# Patient Record
Sex: Male | Born: 1975 | Race: White | Hispanic: No | Marital: Single | Smoking: Former smoker
Health system: Southern US, Community
[De-identification: ages and names within clinical notes are randomized; demographics above are authoritative.]

## PROBLEM LIST (undated history)

## (undated) HISTORY — PX: OTHER SURGICAL HISTORY: SHX169

---

## 2011-05-30 ENCOUNTER — Emergency Department: Payer: Self-pay | Admitting: Emergency Medicine

## 2016-12-03 DIAGNOSIS — F1290 Cannabis use, unspecified, uncomplicated: Secondary | ICD-10-CM | POA: Insufficient documentation

## 2016-12-03 DIAGNOSIS — L03211 Cellulitis of face: Secondary | ICD-10-CM | POA: Insufficient documentation

## 2016-12-03 DIAGNOSIS — F17200 Nicotine dependence, unspecified, uncomplicated: Secondary | ICD-10-CM | POA: Insufficient documentation

## 2016-12-03 LAB — COMPREHENSIVE METABOLIC PANEL
ALK PHOS: 74 U/L (ref 38–126)
ALT: 22 U/L (ref 17–63)
AST: 25 U/L (ref 15–41)
Albumin: 3.9 g/dL (ref 3.5–5.0)
Anion gap: 7 (ref 5–15)
BUN: 12 mg/dL (ref 6–20)
CALCIUM: 8.8 mg/dL — AB (ref 8.9–10.3)
CO2: 27 mmol/L (ref 22–32)
Chloride: 102 mmol/L (ref 101–111)
Creatinine, Ser: 0.86 mg/dL (ref 0.61–1.24)
GFR calc Af Amer: >60 mL/min (ref >60)
GFR calc non Af Amer: >60 mL/min (ref >60)
GLUCOSE: 98 mg/dL (ref 65–99)
Potassium: 3.6 mmol/L (ref 3.5–5.1)
SODIUM: 136 mmol/L (ref 135–145)
Total Bilirubin: 0.4 mg/dL (ref 0.3–1.2)
Total Protein: 7.4 g/dL (ref 6.5–8.1)

## 2016-12-03 LAB — CBC WITH DIFFERENTIAL/PLATELET
Basophils Absolute: 0.1 10*3/uL (ref 0–0.1)
Basophils Relative: 1 %
EOS PCT: 1 %
Eosinophils Absolute: 0.1 10*3/uL (ref 0–0.7)
HEMATOCRIT: 41.3 % (ref 40.0–52.0)
Hemoglobin: 13.9 g/dL (ref 13.0–18.0)
LYMPHS PCT: 17 %
Lymphs Abs: 2.5 10*3/uL (ref 1.0–3.6)
MCH: 30.3 pg (ref 26.0–34.0)
MCHC: 33.8 g/dL (ref 32.0–36.0)
MCV: 89.6 fL (ref 80.0–100.0)
Monocytes Absolute: 1.7 10*3/uL — ABNORMAL HIGH (ref 0.2–1.0)
Monocytes Relative: 12 %
NEUTROS ABS: 10.1 10*3/uL — AB (ref 1.4–6.5)
NEUTROS PCT: 69 %
Platelets: 311 10*3/uL (ref 150–440)
RBC: 4.6 MIL/uL (ref 4.40–5.90)
RDW: 13.6 % (ref 11.5–14.5)
WBC: 14.5 10*3/uL — AB (ref 3.8–10.6)

## 2016-12-03 NOTE — ED Triage Notes (Signed)
Patient reports swelling and pain to left face.  Noted swelling and redness to left upper jaw.  Patient reports he got antibiotics from friends but it has not helped.

## 2016-12-04 ENCOUNTER — Emergency Department
Admission: EM | Admit: 2016-12-04 | Discharge: 2016-12-04 | Disposition: A | Discharge status: Home / Self Care | Payer: Self-pay | Attending: Emergency Medicine | Admitting: Emergency Medicine

## 2016-12-04 ENCOUNTER — Emergency Department: Payer: Self-pay

## 2016-12-04 ENCOUNTER — Encounter: Payer: Self-pay | Admitting: Emergency Medicine

## 2016-12-04 DIAGNOSIS — L03211 Cellulitis of face: Secondary | ICD-10-CM

## 2016-12-04 MED ORDER — CLINDAMYCIN PHOSPHATE 600 MG/50ML IV SOLN
600.0000 mg | Freq: Once | INTRAVENOUS | Status: AC
  Administered 2016-12-04: 600 mg via INTRAVENOUS
  Filled 2016-12-04: qty 50

## 2016-12-04 MED ORDER — ONDANSETRON 4 MG PO TBDP: ORAL_TABLET | ORAL | 0 refills | Status: DC

## 2016-12-04 MED ORDER — HYDROCODONE-ACETAMINOPHEN 5-325 MG PO TABS: 1.0000 | ORAL_TABLET | ORAL | 0 refills | Status: DC | PRN

## 2016-12-04 MED ORDER — OXYCODONE-ACETAMINOPHEN 5-325 MG PO TABS
1.0000 | ORAL_TABLET | Freq: Once | ORAL | Status: AC
  Administered 2016-12-04: 1 via ORAL
  Filled 2016-12-04: qty 1

## 2016-12-04 MED ORDER — CLINDAMYCIN HCL 150 MG PO CAPS: 450.0000 mg | ORAL_CAPSULE | Freq: Four times a day (QID) | ORAL | 0 refills | Status: DC

## 2016-12-04 MED ORDER — DEXAMETHASONE SODIUM PHOSPHATE 10 MG/ML IJ SOLN
10.0000 mg | Freq: Once | INTRAMUSCULAR | Status: AC
  Administered 2016-12-04: 10 mg via INTRAVENOUS
  Filled 2016-12-04: qty 1

## 2016-12-04 MED ORDER — DOCUSATE SODIUM 100 MG PO CAPS: ORAL_CAPSULE | ORAL | 0 refills | Status: DC

## 2016-12-04 NOTE — Discharge Instructions (Signed)
As we discussed, it appears that you have an infection in your face that needs antibiotics.  We gave you a dose of clindamycin 600 mg IV as well as Decadron 10 mg IV which is a steroid which should help decrease the inflammation.  Please call this morning to the ENT clinic and set up the next available follow-up appointment with either Dr. Willeen Cass or one of his colleagues.  It is important that you take the prescribed antibiotics for the full course of treatment and follow up with the specialist within the next few days.  Please return immediately to the emergency department if you feel like your symptoms are getting worse or if you develop new symptoms that concern you.

## 2016-12-04 NOTE — ED Notes (Signed)
Pt waiting patiently for treatment room; says pressure to the left side of his face is increasing; now has headache across the back of his head; will discuss with MD

## 2016-12-04 NOTE — ED Provider Notes (Signed)
[facility] Emergency Department Provider Note  ____________________________________________   First MD Initiated Contact with Patient 12/04/16 [phone]     (approximate)  I have reviewed the triage vital signs and the nursing notes.   HISTORY  Chief Complaint Abscess    HPI Johnathan Wagner is a 41 y.o. male with no significant past medical history who presents for evaluation of pain and swelling in the left side of his face.  He reports that it started about 4 days ago and has gradually gotten worse.  It is painful to the touch and it seems to have started just to the left side of his nose and has spread to below his eye and down his cheek.  He feels a firmness to the skin but there has been no open wound and no drainage.  In spite of the swelling of his cheek and below his eye, he is not having any trouble with his vision and has not had any double vision.  He is not having trouble moving, opening, nor closing his left eye.  The tenderness to palpation is notable in the cheek but not periorbital.  He is not having any trouble swallowing, his voice is not changed, and he has had no respiratory difficulties.  He does not have a sore throat and although he has chronic problems with his teeth he is not having any dental pain at this time.  He denies fever/chills, chest pain, shortness of breath, nausea, vomiting, abdominal pain, dysuria.   History reviewed. No pertinent past medical history.  There are no active problems to display for this patient.   History reviewed. No pertinent surgical history.  Prior to Admission medications   Medication Sig Start Date End Date Taking? Authorizing Provider  clindamycin (CLEOCIN) 150 MG capsule Take 3 capsules (450 mg total) by mouth 4 (four) times daily. 12/04/16 12/14/16  Loleta Rose, MD  docusate sodium (COLACE) 100 MG capsule Take 1 tablet once or twice daily as needed for constipation while taking narcotic pain medicine 12/04/16    Loleta Rose, MD  HYDROcodone-acetaminophen (NORCO/VICODIN) 5-325 MG tablet Take 1-2 tablets by mouth every 4 (four) hours as needed for moderate pain. 12/04/16   Loleta Rose, MD  ondansetron (ZOFRAN ODT) 4 MG disintegrating tablet Allow 1-2 tablets to dissolve in your mouth every 8 hours as needed for nausea/vomiting 12/04/16   Loleta Rose, MD    Allergies Patient has no known allergies.  History reviewed. No pertinent family history.  Social History Social History  Substance Use Topics  . Smoking status: Current Every Day Smoker  . Smokeless tobacco: Never Used  . Alcohol use Yes    Review of Systems Constitutional: No fever/chills Eyes: No visual changes. ENT: Gradually worsening swelling and pain in the left side of his face just lateral to the nose and spreading throughout his cheek starting about 4 days ago.  No sore throat, no difficulty swallowing. Cardiovascular: Denies chest pain. Respiratory: Denies shortness of breath. Gastrointestinal: No abdominal pain.  No nausea, no vomiting.  No diarrhea.  No constipation. Genitourinary: Negative for dysuria. Musculoskeletal: Negative for back pain. Integumentary: Negative for rash. Neurological: Negative for headaches, focal weakness or numbness.   ____________________________________________   PHYSICAL EXAM:  VITAL SIGNS: ED Triage Vitals  Enc Vitals Group     BP 12/03/16 2308 (!) 151/86     Pulse Rate 12/03/16 2308 (!) 105     Resp 12/03/16 2308 20     Temp 12/03/16 2308 98.5  F (36.9 C)     Temp Source 12/03/16 2308 Oral     SpO2 12/03/16 2308 100 %     Weight 12/03/16 2306 190 lb (86.2 kg)     Height 12/03/16 2306 6' (1.829 m)     Head Circumference --      Peak Flow --      Pain Score 12/04/16 0112 10     Pain Loc --      Pain Edu? --      Excl. in GC? --     Constitutional: Alert and oriented. Well appearing and in no acute distress. Eyes: Conjunctivae are normal. PERRL. EOMI. No evidence of  entrapment.  No chemosis. Head: Atraumatic. Nose: No congestion/rhinnorhea.  Obvious swelling and erythema to the left side of his face with some induration just left-lateral to his nose.  No fluctuence.  There is some left infraorbital lower lid edema but no evidence of orbital cellulitis, just edema from the surrounding facial cellulitis.  There are no obvious intranasal abscesses. Mouth/Throat: Mucous membranes are moist.  Scattered dental caries but without any evidence of dental abscess or infection.  Nontender to palpation.  No fluctuance or induration under tongue.  No evidence of intraoral abscess or Ludwig's angina. Neck: No stridor.  No meningeal signs.  Some left submandibular lymphadenopathy Cardiovascular: Normal rate, regular rhythm. Good peripheral circulation. Grossly normal heart sounds. Respiratory: Normal respiratory effort.  No retractions. Lungs CTAB. Gastrointestinal: Soft and nontender. No distention.  Musculoskeletal: No lower extremity tenderness nor edema. No gross deformities of extremities. Neurologic:  Normal speech and language. No gross focal neurologic deficits are appreciated.  Skin:  Skin is warm, dry and intact. No rash noted. Psychiatric: Mood and affect are normal. Speech and behavior are normal.  ____________________________________________   LABS (all labs ordered are listed, but only abnormal results are displayed)  Labs Reviewed  CBC WITH DIFFERENTIAL/PLATELET - Abnormal; Notable for the following:       Result Value   WBC 14.5 (*)    Neutro Abs 10.1 (*)    Monocytes Absolute 1.7 (*)    All other components within normal limits  COMPREHENSIVE METABOLIC PANEL - Abnormal; Notable for the following:    Calcium 8.8 (*)    All other components within normal limits   ____________________________________________  EKG  None - EKG not ordered by ED physician ____________________________________________  RADIOLOGY   Ct Maxillofacial Wo  Contrast  Result Date: 12/04/2016 CLINICAL DATA:  Swelling and pain to the left face with redness in the upper jaw EXAM: CT MAXILLOFACIAL WITHOUT CONTRAST TECHNIQUE: Multidetector CT imaging of the maxillofacial structures was performed. Multiplanar CT image reconstructions were also generated. A small metallic BB was placed on the right temple in order to reliably differentiate right from left. COMPARISON:  None. FINDINGS: Osseous: Mandibular heads are normally position. No mandibular fracture. Pterygoid plates and zygomatic arches appear within normal limits. No nasal bone fracture. Multiple root lucencies of mandibular molars. Orbits: Orbital walls are intact. The globes are grossly unremarkable. No intra or extraconal soft tissue abnormality is evident. Sinuses: Mild mucosal thickening within the ethmoid and maxillary sinuses. No acute fluid levels. Soft tissues: Large amount of soft tissue swelling over the left nasal area. Large soft tissue swelling in the left infraorbital region anterior to the maxillary sinus and extending inferiorly over the left maxilla and mandible. No definite underlying osseous erosions or periostitis. Multiple enlarged submandibular and cervical nodes. Limited intracranial: No significant or unexpected finding. IMPRESSION: 1.  Large amount of soft tissue swelling involving the left nasal area and the subcutaneous soft tissues anterior to the left maxillary sinus and extending inferiorly over the left mandible consistent with cellulitis. Indeterminate for abscess in the absence of intravenous contrast. 2. Multiple enlarged lymph nodes within the neck, likely reactive. 3. No fracture is visualized. Electronically Signed   By: Jasmine Pang M.D.   On: 12/04/2016 01:58    ____________________________________________   PROCEDURES  Critical Care performed: No   Procedure(s) performed:   Procedures   ____________________________________________   INITIAL IMPRESSION /  ASSESSMENT AND PLAN / ED COURSE  Pertinent labs & imaging results that were available during my care of the patient were reviewed by me and considered in my medical decision making (see chart for details).  I evaluated the patient after his lab work and noncontrast CT scan were already completed.  It appears based on the radiologist's interpretation that he has facial cellulitis without a focal area of abscess.  His vital signs are stable except for occasional borderline tachycardia and he has a mild leukocytosis.  I will give him 600 mg of IV clindamycin for broad antibacterial coverage and a dose of Decadron 10 mg IV to help with the inflammation.  I do not believe he is suffering from orbital cellulitis but rather that he has some periorbital edema that is spreading from the facial infection.  He has no evidence of entrapment, chemosis, or other ophthalmological issue.   Clinical Course as of Dec 04 457  Mon Dec 04, 2016  1610 Checked on the patient, he is sleeping comfortably.  He is comfortable with the plan for close outpatient follow-up and he promises me that he will set up an appointment.  I gave him a prescription for clindamycin and a coupon from [employer] to help with the cost.  I gave strict return precautions for him to return if his symptoms are worsening.  He agrees with the plan  [CF]    Clinical Course User Index [CF] Loleta Rose, MD    ____________________________________________  FINAL CLINICAL IMPRESSION(S) / ED DIAGNOSES  Final diagnoses:  Facial cellulitis     MEDICATIONS GIVEN DURING THIS VISIT:  Medications  oxyCODONE-acetaminophen (PERCOCET/ROXICET) 5-325 MG per tablet 1 tablet (1 tablet Oral Given 12/04/16 0123)  clindamycin (CLEOCIN) IVPB 600 mg (0 mg Intravenous Stopped 12/04/16 0417)  dexamethasone (DECADRON) injection 10 mg (10 mg Intravenous Given 12/04/16 0347)     NEW OUTPATIENT MEDICATIONS STARTED DURING THIS VISIT:  New Prescriptions    CLINDAMYCIN (CLEOCIN) 150 MG CAPSULE    Take 3 capsules (450 mg total) by mouth 4 (four) times daily.   DOCUSATE SODIUM (COLACE) 100 MG CAPSULE    Take 1 tablet once or twice daily as needed for constipation while taking narcotic pain medicine   HYDROCODONE-ACETAMINOPHEN (NORCO/VICODIN) 5-325 MG TABLET    Take 1-2 tablets by mouth every 4 (four) hours as needed for moderate pain.   ONDANSETRON (ZOFRAN ODT) 4 MG DISINTEGRATING TABLET    Allow 1-2 tablets to dissolve in your mouth every 8 hours as needed for nausea/vomiting    Modified Medications   No medications on file    Discontinued Medications   No medications on file     Note:  This document was prepared using Dragon voice recognition software and may include unintentional dictation errors.    Loleta Rose, MD 12/04/16 [phone]

## 2016-12-05 ENCOUNTER — Inpatient Hospital Stay
Admission: EM | Admit: 2016-12-05 | Discharge: 2016-12-07 | DRG: 603 | Disposition: A | Discharge status: Home / Self Care | Payer: Self-pay | Attending: Internal Medicine | Admitting: Internal Medicine

## 2016-12-05 DIAGNOSIS — L0390 Cellulitis, unspecified: Secondary | ICD-10-CM | POA: Diagnosis present

## 2016-12-05 DIAGNOSIS — Z79899 Other long term (current) drug therapy: Secondary | ICD-10-CM

## 2016-12-05 DIAGNOSIS — L03211 Cellulitis of face: Principal | ICD-10-CM | POA: Diagnosis present

## 2016-12-05 DIAGNOSIS — F17200 Nicotine dependence, unspecified, uncomplicated: Secondary | ICD-10-CM | POA: Diagnosis present

## 2016-12-05 DIAGNOSIS — R220 Localized swelling, mass and lump, head: Secondary | ICD-10-CM

## 2016-12-05 DIAGNOSIS — Z880 Allergy status to penicillin: Secondary | ICD-10-CM

## 2016-12-05 DIAGNOSIS — Z792 Long term (current) use of antibiotics: Secondary | ICD-10-CM

## 2016-12-05 NOTE — ED Triage Notes (Signed)
Pt seen here 2 days ago facial swelling and was given IV antibiotics for dental infection, today swelling became worse to left face.

## 2016-12-06 ENCOUNTER — Encounter: Payer: Self-pay | Admitting: Internal Medicine

## 2016-12-06 DIAGNOSIS — L0390 Cellulitis, unspecified: Secondary | ICD-10-CM | POA: Diagnosis present

## 2016-12-06 LAB — CBC
HEMATOCRIT: 38.4 % — AB (ref 40.0–52.0)
Hemoglobin: 13 g/dL (ref 13.0–18.0)
MCH: 30 pg (ref 26.0–34.0)
MCHC: 33.9 g/dL (ref 32.0–36.0)
MCV: 88.5 fL (ref 80.0–100.0)
PLATELETS: 313 10*3/uL (ref 150–440)
RBC: 4.34 MIL/uL — ABNORMAL LOW (ref 4.40–5.90)
RDW: 13.8 % (ref 11.5–14.5)
WBC: 15.6 10*3/uL — ABNORMAL HIGH (ref 3.8–10.6)

## 2016-12-06 LAB — BASIC METABOLIC PANEL
Anion gap: 5 (ref 5–15)
Anion gap: 8 (ref 5–15)
BUN: 14 mg/dL (ref 6–20)
BUN: 17 mg/dL (ref 6–20)
CHLORIDE: 101 mmol/L (ref 101–111)
CHLORIDE: 103 mmol/L (ref 101–111)
CO2: 27 mmol/L (ref 22–32)
CO2: 30 mmol/L (ref 22–32)
CREATININE: 0.61 mg/dL (ref 0.61–1.24)
CREATININE: 0.64 mg/dL (ref 0.61–1.24)
Calcium: 8.4 mg/dL — ABNORMAL LOW (ref 8.9–10.3)
Calcium: 8.7 mg/dL — ABNORMAL LOW (ref 8.9–10.3)
GFR calc Af Amer: >60 mL/min (ref >60)
GFR calc non Af Amer: >60 mL/min (ref >60)
GFR calc non Af Amer: >60 mL/min (ref >60)
GLUCOSE: 124 mg/dL — AB (ref 65–99)
Glucose, Bld: 121 mg/dL — ABNORMAL HIGH (ref 65–99)
POTASSIUM: 3.7 mmol/L (ref 3.5–5.1)
Potassium: 3.9 mmol/L (ref 3.5–5.1)
SODIUM: 136 mmol/L (ref 135–145)
SODIUM: 138 mmol/L (ref 135–145)

## 2016-12-06 LAB — CBC WITH DIFFERENTIAL/PLATELET
BASOS PCT: 1 %
Basophils Absolute: 0.1 10*3/uL (ref 0–0.1)
EOS PCT: 1 %
Eosinophils Absolute: 0.1 10*3/uL (ref 0–0.7)
HCT: 39.8 % — ABNORMAL LOW (ref 40.0–52.0)
HEMOGLOBIN: 13.7 g/dL (ref 13.0–18.0)
LYMPHS ABS: 2.8 10*3/uL (ref 1.0–3.6)
Lymphocytes Relative: 19 %
MCH: 30.7 pg (ref 26.0–34.0)
MCHC: 34.5 g/dL (ref 32.0–36.0)
MCV: 89 fL (ref 80.0–100.0)
MONOS PCT: 11 %
Monocytes Absolute: 1.7 10*3/uL — ABNORMAL HIGH (ref 0.2–1.0)
NEUTROS PCT: 68 %
Neutro Abs: 10.2 10*3/uL — ABNORMAL HIGH (ref 1.4–6.5)
PLATELETS: 332 10*3/uL (ref 150–440)
RBC: 4.47 MIL/uL (ref 4.40–5.90)
RDW: 13.9 % (ref 11.5–14.5)
WBC: 14.9 10*3/uL — AB (ref 3.8–10.6)

## 2016-12-06 LAB — MRSA PCR SCREENING: MRSA by PCR: POSITIVE — AB

## 2016-12-06 MED ORDER — SODIUM CHLORIDE 0.9 % IV SOLN: 250.0000 mL | INTRAVENOUS | Status: DC | PRN

## 2016-12-06 MED ORDER — PNEUMOCOCCAL VAC POLYVALENT 25 MCG/0.5ML IJ INJ
0.5000 mL | INJECTION | INTRAMUSCULAR | Status: AC
  Administered 2016-12-07: 0.5 mL via INTRAMUSCULAR
  Filled 2016-12-06: qty 0.5

## 2016-12-06 MED ORDER — VANCOMYCIN HCL IN DEXTROSE 1-5 GM/200ML-% IV SOLN
1000.0000 mg | Freq: Once | INTRAVENOUS | Status: AC
  Administered 2016-12-06: 18:00:00 1000 mg via INTRAVENOUS
  Filled 2016-12-06: qty 200

## 2016-12-06 MED ORDER — MUPIROCIN 2 % EX OINT
1.0000 "application " | TOPICAL_OINTMENT | Freq: Two times a day (BID) | CUTANEOUS | Status: DC
  Administered 2016-12-06 – 2016-12-07 (×2): 1 via NASAL
  Filled 2016-12-06: qty 22

## 2016-12-06 MED ORDER — CARVEDILOL 3.125 MG PO TABS: 3.1250 mg | ORAL_TABLET | Freq: Two times a day (BID) | ORAL | Status: DC

## 2016-12-06 MED ORDER — CHLORHEXIDINE GLUCONATE CLOTH 2 % EX PADS
6.0000 | MEDICATED_PAD | Freq: Every day | CUTANEOUS | Status: DC
  Administered 2016-12-07: 06:00:00 6 via TOPICAL

## 2016-12-06 MED ORDER — SODIUM CHLORIDE 0.9 % IV BOLUS (SEPSIS)
1000.0000 mL | Freq: Once | INTRAVENOUS | Status: AC
  Administered 2016-12-06: 1000 mL via INTRAVENOUS

## 2016-12-06 MED ORDER — DOCUSATE SODIUM 100 MG PO CAPS: 100.0000 mg | ORAL_CAPSULE | Freq: Two times a day (BID) | ORAL | Status: DC

## 2016-12-06 MED ORDER — VANCOMYCIN HCL IN DEXTROSE 1-5 GM/200ML-% IV SOLN
1000.0000 mg | Freq: Two times a day (BID) | INTRAVENOUS | Status: DC
  Administered 2016-12-06 – 2016-12-07 (×2): 1000 mg via INTRAVENOUS
  Filled 2016-12-06 (×3): qty 200

## 2016-12-06 MED ORDER — ACETAMINOPHEN 650 MG RE SUPP: 650.0000 mg | Freq: Four times a day (QID) | RECTAL | Status: DC | PRN

## 2016-12-06 MED ORDER — ACETAMINOPHEN 325 MG PO TABS: 650.0000 mg | ORAL_TABLET | Freq: Four times a day (QID) | ORAL | Status: DC | PRN

## 2016-12-06 MED ORDER — SODIUM CHLORIDE 0.9% FLUSH
3.0000 mL | INTRAVENOUS | Status: DC | PRN
  Administered 2016-12-07: 06:00:00 3 mL via INTRAVENOUS
  Filled 2016-12-06: qty 3

## 2016-12-06 MED ORDER — ENOXAPARIN SODIUM 40 MG/0.4ML ~~LOC~~ SOLN
40.0000 mg | SUBCUTANEOUS | Status: DC
  Administered 2016-12-06: 20:00:00 40 mg via SUBCUTANEOUS
  Filled 2016-12-06: qty 0.4

## 2016-12-06 MED ORDER — SODIUM CHLORIDE 0.9 % IV SOLN
INTRAVENOUS | Status: AC
  Filled 2016-12-06: qty 3

## 2016-12-06 MED ORDER — MORPHINE SULFATE (PF) 2 MG/ML IV SOLN
2.0000 mg | INTRAVENOUS | Status: DC | PRN
  Administered 2016-12-06 (×2): 2 mg via INTRAVENOUS
  Filled 2016-12-06 (×2): qty 1

## 2016-12-06 MED ORDER — ONDANSETRON HCL 4 MG/2ML IJ SOLN
4.0000 mg | Freq: Once | INTRAMUSCULAR | Status: AC
  Administered 2016-12-06: 4 mg via INTRAVENOUS
  Filled 2016-12-06: qty 2

## 2016-12-06 MED ORDER — CEFAZOLIN SODIUM-DEXTROSE 1-4 GM/50ML-% IV SOLN
1.0000 g | Freq: Three times a day (TID) | INTRAVENOUS | Status: DC
  Administered 2016-12-06 – 2016-12-07 (×4): 1 g via INTRAVENOUS
  Filled 2016-12-06 (×6): qty 50

## 2016-12-06 MED ORDER — SODIUM CHLORIDE 0.9 % IV SOLN
3.0000 g | Freq: Once | INTRAVENOUS | Status: AC
  Administered 2016-12-06: 3 g via INTRAVENOUS
  Filled 2016-12-06: qty 3

## 2016-12-06 MED ORDER — ONDANSETRON 4 MG PO TBDP
4.0000 mg | ORAL_TABLET | Freq: Three times a day (TID) | ORAL | Status: DC | PRN
  Filled 2016-12-06: qty 1

## 2016-12-06 MED ORDER — HYDROCODONE-ACETAMINOPHEN 5-325 MG PO TABS: 1.0000 | ORAL_TABLET | ORAL | Status: DC | PRN

## 2016-12-06 MED ORDER — SODIUM CHLORIDE 0.9% FLUSH
3.0000 mL | Freq: Two times a day (BID) | INTRAVENOUS | Status: DC
  Administered 2016-12-06 – 2016-12-07 (×4): 3 mL via INTRAVENOUS

## 2016-12-06 MED ORDER — MORPHINE SULFATE (PF) 4 MG/ML IV SOLN
4.0000 mg | Freq: Once | INTRAVENOUS | Status: AC
  Administered 2016-12-06: 4 mg via INTRAVENOUS
  Filled 2016-12-06: qty 1

## 2016-12-06 NOTE — Consult Note (Signed)
..   Johnathan Wagner, Johnathan Wagner [id number] [birth year] Johnathan Dilling, MD  Reason for Consult: Facial cellulitis  HPI: 41 y.o. Male admitted for IV antibiotics for left sided facial cellulitis.  Was seen in ED several days ago and discharged home on Clindamyicin QID but had persistent swelling and worsened swelling around left eye.  Presented to ED again and admitted for IV therapy.  Reports left sided facial pain.  Denies any cutaneous sores.  Reports poor dentition with several molar teeth.  Has not seen a dentist in quite some time.  Reports facial pain, denies breathing or difficulty swallowing.  Had issues with vision due to swelling but reports that has resolving since being admitted.  Allergies:  Allergies  Allergen Reactions  . Penicillins     Headache,  only had when he was a child     ROS: Review of systems normal other than 12 systems except per HPI.  PMH: History reviewed. No pertinent past medical history.  FH:  Family History  Problem Relation Age of Onset  . Diabetes Father   . CAD Neg Hx   . COPD Neg Hx     SH:  Social History   Social History  . Marital status: Single    Spouse name: N/A  . Number of children: N/A  . Years of education: N/A   Occupational History  . Corporate investment banker    Social History Main Topics  . Smoking status: Current Every Day Smoker    Packs/day: 0.25    Types: Cigarettes  . Smokeless tobacco: Never Used  . Alcohol use Yes  . Drug use: Yes    Types: Marijuana  . Sexual activity: Yes   Other Topics Concern  . Not on file   Social History Narrative  . No narrative on file    PSH:  Past Surgical History:  Procedure Laterality Date  . none      Physical  Exam:  GEN-  CN 2-12 grossly intact and symmetric. EARS-  External ears clear OC/OP-  Edema and erythema of left maxillary gums with purulence coming from around gingival buccal sulcus.  This is able to be milked out into oral cavity. SKIN-  Edema and erythema and induration  around left nasal side wall extending down to mandible on left.  No fluctuance felt.  NOSE- Nasal cavity without polyps or purulence.  NECK-  Reactive lymphadenopathy RESP- unlabored CARD-  RRR   A/P: Facial cellulitis/phlegmon  Plan:  Most likely dental source given drainage site on gum line and location.  Appears to be responding well to current therapy and is autodraining.  CT scan reviewed and no obvious abscess seen.  Recommend continuation of IV abx and if continued improvement ok to discharge home tomorrow.   Thank you for allowing me to participate in the care of this patient.  Please re-consult if any issues arise.   Johnathan Wagner 12/06/2016 1:11 PM

## 2016-12-06 NOTE — Plan of Care (Signed)
Problem: Education: Goal: Knowledge of [facility] Education information/materials will improve Outcome: Progressing Pt likes to be called Johnathan Wagner  PAST MEDICAL HISTORY:  No past medical history on file.       PAST SURGICAL HISTORY: Past Surgical History:  Procedure Laterality Date  . none     Pt is well controlled with home medications

## 2016-12-06 NOTE — Care Management Note (Signed)
Case Management Note  Patient Details  Name: Johnathan Wagner MRN: [id number] Date of Birth: 1976-02-12  Subjective/Objective:                  Met with patient and his sister to discuss discharge planning. His address on file is in [facility] however he states he has no intentions of returning to [facility]. He is currently living with his sister in [place]. He states he has no income and hasn't worked in several months. He has no PCP. He states he is independent with daily activities.  Action/Plan: Free clinics for [facility] area/zip code shared with patient. Contact information delivered for [place]. Application and referral to Medication Management . Notified RN that patient states he is allergic to PCN.   Expected Discharge Date:                  Expected Discharge Plan:     In-House Referral:     Discharge planning Services  CM Consult, [facility], Medication Assistance  Post Acute Care Choice:    Choice offered to:  Patient, Sibling  DME Arranged:    DME Agency:     HH Arranged:    [place] Agency:     Status of Service:  In process, will continue to follow  If discussed at Long Length of Stay Meetings, dates discussed:    Additional Comments:  Marshell Garfinkel, RN 12/06/2016, 11:40 AM

## 2016-12-06 NOTE — Progress Notes (Signed)
Pharmacy Antibiotic Note  Johnathan Wagner is a 41 y.o. male admitted on 12/05/2016 with facial cellulitis.  Pharmacy has been consulted for vancomycin dosing.  Plan: Vancomycin 1000 mg dose given ~1800. Will follow with vancomycin 1000 mg IV q12h beginning at 2300 this evening Goal VT 10-15 mcg/mL VT ordered for 5/4 at 2230 which is prior to 5th dose and should represent steady state.  Kinetics: Actual body weight = 87 kg, CrCl 100 mL/min ke: 0.087 Half-life: 8 hrs Vd: 61 L Cmin ~11 mcg/mL  Height: 6' (182.9 cm) Weight: 191 lb 12.8 oz (87 kg) IBW/kg (Calculated) : 77.6  Temp (24hrs), Avg:98.7 F (37.1 C), Min:98 F (36.7 C), Max:99.2 F (37.3 C)   Recent Labs Lab 12/03/16 2314 12/06/16 0004 12/06/16 0547  WBC 14.5* 14.9* 15.6*  CREATININE 0.86 0.61 0.64    Estimated Creatinine Clearance: 137.4 mL/min (by C-G formula based on SCr of 0.64 mg/dL).    Allergies  Allergen Reactions  . Penicillins     Headache,  only had when he was a child    Antimicrobials this admission: cefazolin 5/2 >>  vancomycin 5/2 >>   Dose adjustments this admission:  Microbiology results: 5/2 BCx: Pending 5/2 MRSA PCR: Positive  Thank you for allowing pharmacy to be a part of this patient's care.  Cindi Carbon, PharmD, BCPS Clinical Pharmacist 12/06/2016 6:10 PM

## 2016-12-06 NOTE — ED Provider Notes (Signed)
[facility] Emergency Department Provider Note   ____________________________________________   First MD Initiated Contact with Patient 12/05/16 2359     (approximate)  I have reviewed the triage vital signs and the nursing notes.   HISTORY  Chief Complaint Facial Swelling    HPI Johnathan Wagner is a 41 y.o. male who returns to the ED from home with a chief complaint of worsening facial swelling. Patient was seen in the ED 2 days ago for same; had a CT scan which demonstrated facial cellulitis without discernible abscess. He was placed on clindamycin and discharged home. Patient reports compliance with clindamycin but states left facial swelling increased this afternoon. Complains of increased pain and swelling. Denies vision changes, throat swelling, difficulty swallowing or breathing. Denies associated fever, chills, chest pain, shortness of breath, abdominal pain, nausea, vomiting.Nothing makes his symptoms better or worse.   Past medical history None  There are no active problems to display for this patient.   No past surgical history on file.  Prior to Admission medications   Medication Sig Start Date End Date Taking? Authorizing Provider  clindamycin (CLEOCIN) 150 MG capsule Take 3 capsules (450 mg total) by mouth 4 (four) times daily. 12/04/16 12/14/16  Loleta Rose, MD  docusate sodium (COLACE) 100 MG capsule Take 1 tablet once or twice daily as needed for constipation while taking narcotic pain medicine 12/04/16   Loleta Rose, MD  HYDROcodone-acetaminophen (NORCO/VICODIN) 5-325 MG tablet Take 1-2 tablets by mouth every 4 (four) hours as needed for moderate pain. 12/04/16   Loleta Rose, MD  ondansetron (ZOFRAN ODT) 4 MG disintegrating tablet Allow 1-2 tablets to dissolve in your mouth every 8 hours as needed for nausea/vomiting 12/04/16   Loleta Rose, MD    Allergies Patient has no known allergies.  No family history on file.  Social  History Social History  Substance Use Topics  . Smoking status: Current Every Day Smoker  . Smokeless tobacco: Never Used  . Alcohol use Yes    Review of Systems   Constitutional: No fever/chills. Eyes: No visual changes. ENT: Positive for left facial pain and swelling. No sore throat. Cardiovascular: Denies chest pain. Respiratory: Denies shortness of breath. Gastrointestinal: No abdominal pain.  No nausea, no vomiting.  No diarrhea.  No constipation. Genitourinary: Negative for dysuria. Musculoskeletal: Negative for back pain. Skin: Negative for rash. Neurological: Negative for headaches, focal weakness or numbness.   ____________________________________________   PHYSICAL EXAM:  VITAL SIGNS: ED Triage Vitals  Enc Vitals Group     BP 12/05/16 2356 (!) 147/103     Pulse Rate 12/05/16 2356 (!) 110     Resp 12/05/16 2356 18     Temp 12/05/16 2356 98.5 F (36.9 C)     Temp Source 12/05/16 2356 Oral     SpO2 12/05/16 2356 98 %     Weight 12/05/16 2349 190 lb (86.2 kg)     Height 12/05/16 2349 6' (1.829 m)     Head Circumference --      Peak Flow --      Pain Score 12/05/16 2349 10     Pain Loc --      Pain Edu? --      Excl. in GC? --     Constitutional: Alert and oriented. Well appearing and in mild acute distress. Eyes: Conjunctivae are normal. PERRL. EOMI without signs of entrapment or orbital involvement. Left periorbital edema. Head: Atraumatic. Nose: No congestion/rhinnorhea. Moderate to large swelling, erythema and firm  induration from left nasal bridge extending to cheek. No fluctuance palpated. Mouth/Throat: Mucous membranes are moist.  Oropharynx slightly erythematous without tonsillar swelling, exudates or peritonsillar abscess. There is no hoarse voice. There is no drooling. There is no fluctuance or induration under the tongue. Scattered dental caries and fillings without obvious abscess. Neck: No stridor.  Supple neck without meningismus. No neck  swelling or masses. Hematological/Lymphatic/Immunilogical: Shotty anterior cervical lymphadenopathy. Cardiovascular: Normal rate, regular rhythm. Grossly normal heart sounds.  Good peripheral circulation. Respiratory: Normal respiratory effort.  No retractions. Lungs CTAB. Gastrointestinal: Soft and nontender. No distention. No abdominal bruits. No CVA tenderness. Musculoskeletal: No lower extremity tenderness nor edema.  No joint effusions. Neurologic:  Normal speech and language. No gross focal neurologic deficits are appreciated. No gait instability. Skin:  Skin is warm, dry and intact. No rash noted. No petechiae. Psychiatric: Mood and affect are normal. Speech and behavior are normal.  ____________________________________________   LABS (all labs ordered are listed, but only abnormal results are displayed)  Labs Reviewed  CBC WITH DIFFERENTIAL/PLATELET - Abnormal; Notable for the following:       Result Value   WBC 14.9 (*)    HCT 39.8 (*)    Neutro Abs 10.2 (*)    Monocytes Absolute 1.7 (*)    All other components within normal limits  BASIC METABOLIC PANEL - Abnormal; Notable for the following:    Glucose, Bld 121 (*)    Calcium 8.7 (*)    All other components within normal limits  CULTURE, BLOOD (ROUTINE X 2)  CULTURE, BLOOD (ROUTINE X 2)   ____________________________________________  EKG  None ____________________________________________  RADIOLOGY  None ____________________________________________   PROCEDURES  Procedure(s) performed: None  Procedures  Critical Care performed: No  ____________________________________________   INITIAL IMPRESSION / ASSESSMENT AND PLAN / ED COURSE  Pertinent labs & imaging results that were available during my care of the patient were reviewed by me and considered in my medical decision making (see chart for details).  41 year old male who returns to the ED for worsening facial cellulitis despite taking clindamycin  for 2 days. Will obtain screening lab work including blood cultures, administer IV analgesia, IV Unasyn for antibiotic. Anticipate hospitalization for IV antibiotics.  Clinical Course as of Dec 06 113  Wed Dec 06, 2016  0115 Updated patient of laboratory results. Will discuss with hospitalist to evaluate patient in the emergency department for admission.  [JS]    Clinical Course User Index [JS] Irean Hong, MD   Hold repeat CT scan at this time as patient had one on 4/30.   ____________________________________________   FINAL CLINICAL IMPRESSION(S) / ED DIAGNOSES  Final diagnoses:  Left facial swelling  Facial cellulitis      NEW MEDICATIONS STARTED DURING THIS VISIT:  New Prescriptions   No medications on file     Note:  This document was prepared using Dragon voice recognition software and may include unintentional dictation errors.    Irean Hong, MD 12/06/16 [id number]

## 2016-12-06 NOTE — Care Management (Signed)
RNCM left a list of dental clinics with patient.

## 2016-12-06 NOTE — Progress Notes (Signed)
[facility] [place] [facility]   PATIENT NAME: Johnathan Wagner    MR#:  [id number]  DATE OF BIRTH:  1976-05-22  SUBJECTIVE:  CHIEF COMPLAINT:   Chief Complaint  Patient presents with  . Facial Swelling    Patient came with complaint of left-sided facial swelling, which is getting worse in spite of getting oral clindamycin from ER 2 days ago.   On IV antibiotic for now and still complain of his swelling getting worse today.  REVIEW OF SYSTEMS:  CONSTITUTIONAL: No fever, fatigue or weakness.  EYES: No blurred or double vision.  EARS, NOSE, AND THROAT: No tinnitus or ear pain. Swelling on left face. RESPIRATORY: No cough, shortness of breath, wheezing or hemoptysis.  CARDIOVASCULAR: No chest pain, orthopnea, edema.  GASTROINTESTINAL: No nausea, vomiting, diarrhea or abdominal pain.  GENITOURINARY: No dysuria, hematuria.  ENDOCRINE: No polyuria, nocturia,  HEMATOLOGY: No anemia, easy bruising or bleeding SKIN: No rash or lesion. MUSCULOSKELETAL: No joint pain or arthritis.   NEUROLOGIC: No tingling, numbness, weakness.  PSYCHIATRY: No anxiety or depression.   ROS  DRUG ALLERGIES:   Allergies  Allergen Reactions  . Penicillins     Headache,  only had when he was a child     VITALS:  Blood pressure (!) 142/88, pulse (!) 101, temperature 99.1 F (37.3 C), temperature source Oral, resp. rate 18, height 6' (1.829 m), weight 87 kg (191 lb 12.8 oz), SpO2 97 %.  PHYSICAL EXAMINATION:   GENERAL:  41 y.o.-year-old patient lying in the bed with no acute distress.  EYES: Pupils equal, round, reactive to light and accommodation. No scleral icterus. Extraocular muscles intact.  HEENT: Head atraumatic, normocephalic. Oropharynx and nasopharynx clear.  Swelling around left eye Left maxillary are red, tender and swollen, I checked his oropharynx and he does not have any significant infections or any source in his teeth. NECK:  Supple, no jugular venous distention. No  thyroid enlargement, lymph adenopathy noted LUNGS: Normal breath sounds bilaterally, no wheezing, rales,rhonchi or crepitation. No use of accessory muscles of respiration.  CARDIOVASCULAR: S1, S2 normal. No murmurs, rubs, or gallops.  ABDOMEN: Soft, nontender, nondistended. Bowel sounds present. No organomegaly or mass.  EXTREMITIES: No pedal edema, cyanosis, or clubbing.  NEUROLOGIC: Cranial nerves II through XII are intact. Muscle strength 5/5 in all extremities. Sensation intact. Gait not checked.  PSYCHIATRIC: The patient is alert and oriented x 3.  SKIN: redness and swelling left facial area extending below the left eye to angle of mouth.  Physical Exam LABORATORY PANEL:   CBC  Recent Labs Lab 12/06/16 0547  WBC 15.6*  HGB 13.0  HCT 38.4*  PLT 313   ------------------------------------------------------------------------------------------------------------------  Chemistries   Recent Labs Lab 12/03/16 2314  12/06/16 0547  NA 136  < > 138  K 3.6  < > 3.7  CL 102  < > 103  CO2 27  < > 30  GLUCOSE 98  < > 124*  BUN 12  < > 14  CREATININE 0.86  < > 0.64  CALCIUM 8.8*  < > 8.4*  AST 25  --   --   ALT 22  --   --   ALKPHOS 74  --   --   BILITOT 0.4  --   --   < > = values in this interval not displayed. ------------------------------------------------------------------------------------------------------------------  Cardiac Enzymes No results for input(s): TROPONINI in the last 168 hours. ------------------------------------------------------------------------------------------------------------------  RADIOLOGY:  No results found.  ASSESSMENT AND PLAN:  Active Problems:   Cellulitis  * Left face cellulitis   IV cefazolin.    Will check MRSA screen.   Will call ENT to see, if any drainable abscess as his swelling is getting worse.  * leukocytosis  reactive LN pathy   Due to above.  All the records are reviewed and case discussed with Care  Management/Social Workerr. Management plans discussed with the patient, family and they are in agreement.  CODE STATUS: Full.  TOTAL TIME TAKING CARE OF THIS PATIENT: 35 minutes.     POSSIBLE D/C IN 1-2 DAYS, DEPENDING ON CLINICAL CONDITION.   [name] M.D on 12/06/2016   Between 7am to 6pm - Pager - [phone]  After 6pm go to [email or web address] - Social research officer, government  [facility]  Office  [phone]  CC: Primary care physician; No PCP Per Patient  Note: This dictation was prepared with Dragon dictation along with smaller phrase technology. Any transcriptional errors that result from this process are unintentional.

## 2016-12-06 NOTE — H&P (Signed)
[facility] - [place] [facility]   PATIENT NAME: Johnathan Wagner    MR#:  [id number]  DATE OF BIRTH:  1975/10/10  DATE OF ADMISSION:  12/05/2016  PRIMARY CARE PHYSICIAN: No PCP Per Patient   REQUESTING/REFERRING PHYSICIAN:   CHIEF COMPLAINT:   Chief Complaint  Patient presents with  . Facial Swelling    HISTORY OF PRESENT ILLNESS: Yifan Auker  is a 41 y.o. male with no significant past medical history presented to the emergency room with swelling of the left side of the face extending below the eye up to the angle of the mouth on the left side. Patient has redness, swelling of the left side of the face and pain. The pain is dull aching in nature 7 out of 10 on a scale of 1-10. No change in vision. Patient was seen here at the emergency room couple of days ago was evaluated for left facial cellulitis and was discharged home on oral clindamycin antibiotic. But since one day the swelling in the left side of the face increased and redness also increased and patient has more severe pain. He was evaluated with a CT maxillofacial area during the last presentation in the emergency room. Patient was started on IV antibiotics in the emergency room and hospitalist service was further consulted. No fever and chills. No chest pain and shortness of breath.  PAST MEDICAL HISTORY:  No past medical history on file.  PAST SURGICAL HISTORY: Past Surgical History:  Procedure Laterality Date  . none      SOCIAL HISTORY:  Social History  Substance Use Topics  . Smoking status: Current Every Day Smoker  . Smokeless tobacco: Never Used  . Alcohol use Yes    FAMILY HISTORY:  Family History  Problem Relation Age of Onset  . Diabetes Father   . CAD Neg Hx   . COPD Neg Hx     DRUG ALLERGIES: No Known Allergies  REVIEW OF SYSTEMS:   CONSTITUTIONAL: No fever, fatigue or weakness.  EYES: No blurred or double vision.  Swelling around left eye EARS, NOSE, AND THROAT: No tinnitus or  ear pain.  RESPIRATORY: No cough, shortness of breath, wheezing or hemoptysis.  CARDIOVASCULAR: No chest pain, orthopnea, edema.  GASTROINTESTINAL: No nausea, vomiting, diarrhea or abdominal pain.  GENITOURINARY: No dysuria, hematuria.  ENDOCRINE: No polyuria, nocturia,  HEMATOLOGY: No anemia, easy bruising or bleeding SKIN: redness left side of face MUSCULOSKELETAL: No joint pain or arthritis.   NEUROLOGIC: No tingling, numbness, weakness.  PSYCHIATRY: No anxiety or depression.   MEDICATIONS AT HOME:  Prior to Admission medications   Medication Sig Start Date End Date Taking? Authorizing Provider  clindamycin (CLEOCIN) 150 MG capsule Take 3 capsules (450 mg total) by mouth 4 (four) times daily. 12/04/16 12/14/16 Yes Loleta Rose, MD  docusate sodium (COLACE) 100 MG capsule Take 1 tablet once or twice daily as needed for constipation while taking narcotic pain medicine 12/04/16  Yes Loleta Rose, MD  HYDROcodone-acetaminophen (NORCO/VICODIN) 5-325 MG tablet Take 1-2 tablets by mouth every 4 (four) hours as needed for moderate pain. 12/04/16  Yes Loleta Rose, MD  ondansetron (ZOFRAN ODT) 4 MG disintegrating tablet Allow 1-2 tablets to dissolve in your mouth every 8 hours as needed for nausea/vomiting 12/04/16  Yes Loleta Rose, MD      PHYSICAL EXAMINATION:   VITAL SIGNS: Blood pressure (!) 150/104, pulse 72, temperature 98.5 F (36.9 C), temperature source Oral, resp. rate 18, height 6' (1.829 m), weight 86.2  kg (190 lb), SpO2 98 %.  GENERAL:  41 y.o.-year-old patient lying in the bed with no acute distress.  EYES: Pupils equal, round, reactive to light and accommodation. No scleral icterus. Extraocular muscles intact.  HEENT: Head atraumatic, normocephalic. Oropharynx and nasopharynx clear.  Swelling around left eye Left maxillary are red, tender and swollen NECK:  Supple, no jugular venous distention. No thyroid enlargement, lymph adenopathy noted LUNGS: Normal breath sounds  bilaterally, no wheezing, rales,rhonchi or crepitation. No use of accessory muscles of respiration.  CARDIOVASCULAR: S1, S2 normal. No murmurs, rubs, or gallops.  ABDOMEN: Soft, nontender, nondistended. Bowel sounds present. No organomegaly or mass.  EXTREMITIES: No pedal edema, cyanosis, or clubbing.  NEUROLOGIC: Cranial nerves II through XII are intact. Muscle strength 5/5 in all extremities. Sensation intact. Gait not checked.  PSYCHIATRIC: The patient is alert and oriented x 3.  SKIN: redness and swelling left facial area extending below the left eye to angle of mouth.  LABORATORY PANEL:   CBC  Recent Labs Lab 12/03/16 2314 12/06/16 0004  WBC 14.5* 14.9*  HGB 13.9 13.7  HCT 41.3 39.8*  PLT 311 332  MCV 89.6 89.0  MCH 30.3 30.7  MCHC 33.8 34.5  RDW 13.6 13.9  LYMPHSABS 2.5 2.8  MONOABS 1.7* 1.7*  EOSABS 0.1 0.1  BASOSABS 0.1 0.1   ------------------------------------------------------------------------------------------------------------------  Chemistries   Recent Labs Lab 12/03/16 2314 12/06/16 0004  NA 136 136  K 3.6 3.9  CL 102 101  CO2 27 27  GLUCOSE 98 121*  BUN 12 17  CREATININE 0.86 0.61  CALCIUM 8.8* 8.7*  AST 25  --   ALT 22  --   ALKPHOS 74  --   BILITOT 0.4  --    ------------------------------------------------------------------------------------------------------------------ estimated creatinine clearance is 137.4 mL/min (by C-G formula based on SCr of 0.61 mg/dL). ------------------------------------------------------------------------------------------------------------------ No results for input(s): TSH, T4TOTAL, T3FREE, THYROIDAB in the last 72 hours.  Invalid input(s): FREET3   Coagulation profile No results for input(s): INR, PROTIME in the last 168 hours. ------------------------------------------------------------------------------------------------------------------- No results for input(s): DDIMER in the last 72  hours. -------------------------------------------------------------------------------------------------------------------  Cardiac Enzymes No results for input(s): CKMB, TROPONINI, MYOGLOBIN in the last 168 hours.  Invalid input(s): CK ------------------------------------------------------------------------------------------------------------------ Invalid input(s): POCBNP  ---------------------------------------------------------------------------------------------------------------  Urinalysis No results found for: COLORURINE, APPEARANCEUR, LABSPEC, PHURINE, GLUCOSEU, HGBUR, BILIRUBINUR, KETONESUR, PROTEINUR, UROBILINOGEN, NITRITE, LEUKOCYTESUR   RADIOLOGY: No results found.  EKG: No orders found for this or any previous visit.  IMPRESSION AND PLAN: 41 year old male patient with no significant past medical history presented to the emergency room with left facial redness, swelling and pain. Admitting diagnosis 1. Left facial cellulitis 2. Left facial pain 3. Leukocytosis 4. Reactive lymphadenopathy Treatment plan Admit patient to medical floor Start patient on IV cefazolin Antibiotic Every 8 Hourly Follow-up WBC count Pain management with oral Percocet and IV morphine DVT prophylaxis subcutaneous Lovenox 40 MG daily Supportive care.  All the records are reviewed and case discussed with ED provider. Management plans discussed with the patient, family and they are in agreement.  CODE STATUS:FULL CODE Code Status History    This patient does not have a recorded code status. Please follow your organizational policy for patients in this situation.       TOTAL TIME TAKING CARE OF THIS PATIENT: 50 minutes.    Ihor Austin M.D on 12/06/2016 at 2:08 AM  Between 7am to 6pm - Pager - [phone]  After 6pm go to [email or web address] - password [email or web address] [facility]  Office  [phone]  CC: Primary care physician; No PCP Per Patient

## 2016-12-06 NOTE — Plan of Care (Addendum)
Problem: Safety: Goal: Ability to remain free from injury will improve Outcome: Progressing Patient is independent and with call the nurse or aide with any needs   Problem: Pain Managment: Goal: General experience of comfort will improve Outcome: Progressing Patient given morphine times 1 with improvement

## 2016-12-07 MED ORDER — CHLORHEXIDINE GLUCONATE CLOTH 2 % EX PADS: 6.0000 | MEDICATED_PAD | Freq: Every day | CUTANEOUS | 0 refills | Status: AC

## 2016-12-07 MED ORDER — SODIUM CHLORIDE 0.9 % IV SOLN
3.0000 g | Freq: Four times a day (QID) | INTRAVENOUS | Status: DC
  Administered 2016-12-07: 11:00:00 3 g via INTRAVENOUS
  Filled 2016-12-07 (×3): qty 3

## 2016-12-07 MED ORDER — DOXYCYCLINE HYCLATE 100 MG PO CAPS: 100.0000 mg | ORAL_CAPSULE | Freq: Two times a day (BID) | ORAL | 0 refills | Status: AC

## 2016-12-07 MED ORDER — MUPIROCIN 2 % EX OINT: 1.0000 "application " | TOPICAL_OINTMENT | Freq: Two times a day (BID) | CUTANEOUS | 0 refills | Status: AC

## 2016-12-07 MED ORDER — AMOXICILLIN-POT CLAVULANATE 875-125 MG PO TABS: 1.0000 | ORAL_TABLET | Freq: Two times a day (BID) | ORAL | 0 refills | Status: AC

## 2016-12-07 NOTE — Care Management (Addendum)
RNCM spoke with [facility] and they have all medications except Colace, narcotic, and Cholorhexidine Gluconate- patient will have to get these on his own at his preferred pharmacy. Message left for patient's sister Elana Almishe [phone]. [facility] before 430P. RN updated. Patient's sister callback this RNCM back. She appreciates the help with medications and will provided patient transportation to home today. No other RNCM needs.

## 2016-12-07 NOTE — Discharge Instructions (Signed)
Follow in a dentist clinic in 2 weeks.

## 2016-12-07 NOTE — Discharge Summary (Signed)
[facility] [place] at [facility]   PATIENT NAME: Johnathan Wagner    MR#:  [id number]  DATE OF BIRTH:  09/18/1975  DATE OF ADMISSION:  12/05/2016 ADMITTING PHYSICIAN: Ihor Austin, MD  DATE OF DISCHARGE: 12/07/2016  PRIMARY CARE PHYSICIAN: No PCP Per Patient    ADMISSION DIAGNOSIS:  Left facial swelling [R22.0] Facial cellulitis [L03.211]  DISCHARGE DIAGNOSIS:  Active Problems:   Cellulitis   SECONDARY DIAGNOSIS:  History reviewed. No pertinent past medical history.  HOSPITAL COURSE:   * Left face cellulitis   IV cefazolin.    positive for MRSA screen.   ENT consult called in, as his swelling is getting better- suggested to cont Abx.   Added IV vanc- received 2 doses in hospital.   Given one dose iV unasyn, tolerated- give augmentin and doxy on d/c.   Also giving mupirocin nasal cream and chlorhexidine wash for MRSA.  Advise to follow with Dentist in office at discharge.  * leukocytosis  reactive LN pathy   Due to above.  * Advise to fol  DISCHARGE CONDITIONS:   Stable.  CONSULTS OBTAINED:  Treatment Team:  Bud Face, MD  DRUG ALLERGIES:   Allergies  Allergen Reactions  . Penicillins     Headache,  only had when he was a child     DISCHARGE MEDICATIONS:   Current Discharge Medication List    START taking these medications   Details  amoxicillin-clavulanate (AUGMENTIN) 875-125 MG tablet Take 1 tablet by mouth 2 (two) times daily. Qty: 14 tablet, Refills: 0    Chlorhexidine Gluconate Cloth 2 % PADS Apply 6 each topically daily at 6 (six) AM. Qty: 30 each, Refills: 0    doxycycline (VIBRAMYCIN) 100 MG capsule Take 1 capsule (100 mg total) by mouth 2 (two) times daily. Qty: 14 capsule, Refills: 0    mupirocin ointment (BACTROBAN) 2 % Place 1 application into the nose 2 (two) times daily. Nasal application Qty: 22 g, Refills: 0      CONTINUE these medications which have NOT CHANGED   Details  docusate sodium (COLACE)  100 MG capsule Take 1 tablet once or twice daily as needed for constipation while taking narcotic pain medicine Qty: 30 capsule, Refills: 0    HYDROcodone-acetaminophen (NORCO/VICODIN) 5-325 MG tablet Take 1-2 tablets by mouth every 4 (four) hours as needed for moderate pain. Qty: 15 tablet, Refills: 0    ondansetron (ZOFRAN ODT) 4 MG disintegrating tablet Allow 1-2 tablets to dissolve in your mouth every 8 hours as needed for nausea/vomiting Qty: 30 tablet, Refills: 0      STOP taking these medications     clindamycin (CLEOCIN) 150 MG capsule          DISCHARGE INSTRUCTIONS:    Follow with dentist in 2 weeks.  If you experience worsening of your admission symptoms, develop shortness of breath, life threatening emergency, suicidal or homicidal thoughts you must seek medical attention immediately by calling 911 or calling your MD immediately  if symptoms less severe.  You Must read complete instructions/literature along with all the possible adverse reactions/side effects for all the Medicines you take and that have been prescribed to you. Take any new Medicines after you have completely understood and accept all the possible adverse reactions/side effects.   Please note  You were cared for by a hospitalist during your hospital stay. If you have any questions about your discharge medications or the care you received while you were in the hospital after you  are discharged, you can call the unit and asked to speak with the hospitalist on call if the hospitalist that took care of you is not available. Once you are discharged, your primary care physician will handle any further medical issues. Please note that NO REFILLS for any discharge medications will be authorized once you are discharged, as it is imperative that you return to your primary care physician (or establish a relationship with a primary care physician if you do not have one) for your aftercare needs so that they can reassess  your need for medications and monitor your lab values.    Today   CHIEF COMPLAINT:   Chief Complaint  Patient presents with  . Facial Swelling    HISTORY OF PRESENT ILLNESS:  Johnathan Wagner  is a 41 y.o. male with no significant past medical history presented to the emergency room with swelling of the left side of the face extending below the eye up to the angle of the mouth on the left side. Patient has redness, swelling of the left side of the face and pain. The pain is dull aching in nature 7 out of 10 on a scale of 1-10. No change in vision. Patient was seen here at the emergency room couple of days ago was evaluated for left facial cellulitis and was discharged home on oral clindamycin antibiotic. But since one day the swelling in the left side of the face increased and redness also increased and patient has more severe pain. He was evaluated with a CT maxillofacial area during the last presentation in the emergency room. Patient was started on IV antibiotics in the emergency room and hospitalist service was further consulted. No fever and chills. No chest pain and shortness of breath.    VITAL SIGNS:  Blood pressure 125/87, pulse 98, temperature 98.5 F (36.9 C), temperature source Oral, resp. rate 20, height 6' (1.829 m), weight 87 kg (191 lb 12.8 oz), SpO2 98 %.  I/O:   Intake/Output Summary (Last 24 hours) at 12/07/16 1249 Last data filed at 12/07/16 4098  Gross per 24 hour  Intake              300 ml  Output                1 ml  Net              299 ml    PHYSICAL EXAMINATION:   GENERAL: 41 y.o.-year-old patient lying in the bed with no acute distress.  EYES: Pupils equal, round, reactive to light and accommodation. No scleral icterus. Extraocular muscles intact.  HEENT: Head atraumatic, normocephalic. Oropharynx and nasopharynx clear.  Swelling around left eye- better than yesterday. Left maxillary are red, tender and swollen, I checked his oropharynx and he does not have  any significant infections or any source in his teeth. NECK: Supple, no jugular venous distention. No thyroid enlargement, lymph adenopathy noted LUNGS: Normal breath sounds bilaterally, no wheezing, rales,rhonchi or crepitation. No use of accessory muscles of respiration.  CARDIOVASCULAR: S1, S2 normal. No murmurs, rubs, or gallops.  ABDOMEN: Soft, nontender, nondistended. Bowel sounds present. No organomegaly or mass.  EXTREMITIES: No pedal edema, cyanosis, or clubbing.  NEUROLOGIC: Cranial nerves II through XII are intact. Muscle strength 5/5 in all extremities. Sensation intact. Gait not checked.  PSYCHIATRIC: The patient is alert and oriented x 3.  SKIN: redness and swelling left facial area extending below the left eye to angle of mouth.  DATA REVIEW:  CBC  Recent Labs Lab 12/06/16 0547  WBC 15.6*  HGB 13.0  HCT 38.4*  PLT 313    Chemistries   Recent Labs Lab 12/03/16 2314  12/06/16 0547  NA 136  < > 138  K 3.6  < > 3.7  CL 102  < > 103  CO2 27  < > 30  GLUCOSE 98  < > 124*  BUN 12  < > 14  CREATININE 0.86  < > 0.64  CALCIUM 8.8*  < > 8.4*  AST 25  --   --   ALT 22  --   --   ALKPHOS 74  --   --   BILITOT 0.4  --   --   < > = values in this interval not displayed.  Cardiac Enzymes No results for input(s): TROPONINI in the last 168 hours.  Microbiology Results  Results for orders placed or performed during the hospital encounter of 12/05/16  Culture, blood (routine x 2)     Status: None (Preliminary result)   Collection Time: 12/06/16 12:04 AM  Result Value Ref Range Status   Specimen Description [facility]LOOD LEFT [facility]  Final   Special Requests   Final    BOTTLES DRAWN AEROBIC AND ANAEROBIC Blood Culture results may not be optimal due to an excessive volume of blood received in culture bottles   Culture NO GROWTH 1 DAY  Final   Report Status PENDING  Incomplete  Culture, blood (routine x 2)     Status: None (Preliminary result)   Collection Time: 12/06/16 12:04  AM  Result Value Ref Range Status   Specimen Description BLOOD RIGHT AC  Final   Special Requests   Final    BOTTLES DRAWN AEROBIC AND ANAEROBIC Blood Culture results may not be optimal due to an excessive volume of blood received in culture bottles   Culture NO GROWTH 1 DAY  Final   Report Status PENDING  Incomplete  MRSA PCR Screening     Status: Abnormal   Collection Time: 12/06/16  1:43 PM  Result Value Ref Range Status   MRSA by PCR POSITIVE (A) NEGATIVE Final    Comment:        The GeneXpert MRSA Assay (FDA approved for NASAL specimens only), is one component of a comprehensive MRSA colonization surveillance program. It is not intended to diagnose MRSA infection nor to guide or monitor treatment for MRSA infections. RESULT CALLED TO, READ BACK BY AND VERIFIED WITH: SHAE BREWER 12/06/16 @ 1540  MLK     RADIOLOGY:  No results found.  EKG:  No orders found for this or any previous visit.    Management plans discussed with the patient, family and they are in agreement.  CODE STATUS:     Code Status Orders        Start     Ordered   12/06/16 0254  Full code  Continuous     12/06/16 0253    Code Status History    Date Active Date Inactive Code Status Order ID Comments User Context   This patient has a current code status but no historical code status.      TOTAL TIME TAKING CARE OF THIS PATIENT: 35 minutes.    Altamese DillingVACHHANI, Lusia Greis M.D on 12/07/2016 at 12:49 PM  Between 7am to 6pm - Pager - [phone]  After 6pm go to [email or web address] - Social research officer, governmentpassword EPAS [facility]  [facility]  Office  [phone]  CC: Primary care physician; No PCP Per  Patient   Note: This dictation was prepared with Dragon dictation along with smaller phrase technology. Any transcriptional errors that result from this process are unintentional.

## 2016-12-07 NOTE — Progress Notes (Signed)
Received MD order to discharge patient to home, reviewed prescriptions home med and follow up appointment with patient and patient verbalized understanding discharged to home with sister

## 2016-12-11 LAB — CULTURE, BLOOD (ROUTINE X 2)
CULTURE: NO GROWTH
Culture: NO GROWTH

## 2016-12-28 ENCOUNTER — Emergency Department (HOSPITAL_COMMUNITY): Payer: No Typology Code available for payment source

## 2016-12-28 ENCOUNTER — Observation Stay (HOSPITAL_COMMUNITY)
Admission: EM | Admit: 2016-12-28 | Discharge: 2016-12-29 | Disposition: A | Discharge status: Home / Self Care | Payer: No Typology Code available for payment source | Attending: Surgery | Admitting: Surgery

## 2016-12-28 ENCOUNTER — Encounter (HOSPITAL_COMMUNITY): Payer: Self-pay | Admitting: Emergency Medicine

## 2016-12-28 DIAGNOSIS — S0101XA Laceration without foreign body of scalp, initial encounter: Secondary | ICD-10-CM | POA: Diagnosis present

## 2016-12-28 DIAGNOSIS — F1910 Other psychoactive substance abuse, uncomplicated: Secondary | ICD-10-CM | POA: Insufficient documentation

## 2016-12-28 DIAGNOSIS — M50222 Other cervical disc displacement at C5-C6 level: Secondary | ICD-10-CM | POA: Diagnosis not present

## 2016-12-28 DIAGNOSIS — F0781 Postconcussional syndrome: Principal | ICD-10-CM | POA: Diagnosis present

## 2016-12-28 DIAGNOSIS — S32009A Unspecified fracture of unspecified lumbar vertebra, initial encounter for closed fracture: Secondary | ICD-10-CM

## 2016-12-28 DIAGNOSIS — M5126 Other intervertebral disc displacement, lumbar region: Secondary | ICD-10-CM | POA: Diagnosis not present

## 2016-12-28 DIAGNOSIS — S32029A Unspecified fracture of second lumbar vertebra, initial encounter for closed fracture: Secondary | ICD-10-CM | POA: Insufficient documentation

## 2016-12-28 DIAGNOSIS — S32019A Unspecified fracture of first lumbar vertebra, initial encounter for closed fracture: Secondary | ICD-10-CM | POA: Insufficient documentation

## 2016-12-28 DIAGNOSIS — Z833 Family history of diabetes mellitus: Secondary | ICD-10-CM | POA: Diagnosis not present

## 2016-12-28 DIAGNOSIS — Z880 Allergy status to penicillin: Secondary | ICD-10-CM | POA: Insufficient documentation

## 2016-12-28 DIAGNOSIS — Z79899 Other long term (current) drug therapy: Secondary | ICD-10-CM | POA: Insufficient documentation

## 2016-12-28 DIAGNOSIS — N200 Calculus of kidney: Secondary | ICD-10-CM | POA: Insufficient documentation

## 2016-12-28 DIAGNOSIS — K5730 Diverticulosis of large intestine without perforation or abscess without bleeding: Secondary | ICD-10-CM | POA: Diagnosis not present

## 2016-12-28 DIAGNOSIS — Y9389 Activity, other specified: Secondary | ICD-10-CM | POA: Insufficient documentation

## 2016-12-28 DIAGNOSIS — F17210 Nicotine dependence, cigarettes, uncomplicated: Secondary | ICD-10-CM | POA: Diagnosis not present

## 2016-12-28 DIAGNOSIS — S32039A Unspecified fracture of third lumbar vertebra, initial encounter for closed fracture: Secondary | ICD-10-CM | POA: Insufficient documentation

## 2016-12-28 LAB — COMPREHENSIVE METABOLIC PANEL
ALK PHOS: 60 U/L (ref 38–126)
ALT: 21 U/L (ref 17–63)
ANION GAP: 9 (ref 5–15)
AST: 27 U/L (ref 15–41)
Albumin: 4 g/dL (ref 3.5–5.0)
BUN: 14 mg/dL (ref 6–20)
CALCIUM: 9 mg/dL (ref 8.9–10.3)
CO2: 24 mmol/L (ref 22–32)
Chloride: 104 mmol/L (ref 101–111)
Creatinine, Ser: 0.92 mg/dL (ref 0.61–1.24)
GFR calc Af Amer: >60 mL/min (ref >60)
Glucose, Bld: 106 mg/dL — ABNORMAL HIGH (ref 65–99)
Potassium: 3.3 mmol/L — ABNORMAL LOW (ref 3.5–5.1)
Sodium: 137 mmol/L (ref 135–145)
TOTAL PROTEIN: 6.7 g/dL (ref 6.5–8.1)
Total Bilirubin: 0.9 mg/dL (ref 0.3–1.2)

## 2016-12-28 LAB — CBC WITH DIFFERENTIAL/PLATELET
Basophils Absolute: 0 10*3/uL (ref 0.0–0.1)
Basophils Relative: 0 %
Eosinophils Absolute: 0.2 10*3/uL (ref 0.0–0.7)
Eosinophils Relative: 1 %
HEMATOCRIT: 40.3 % (ref 39.0–52.0)
Hemoglobin: 13.8 g/dL (ref 13.0–17.0)
LYMPHS ABS: 2.9 10*3/uL (ref 0.7–4.0)
LYMPHS PCT: 22 %
MCH: 30.3 pg (ref 26.0–34.0)
MCHC: 34.2 g/dL (ref 30.0–36.0)
MCV: 88.6 fL (ref 78.0–100.0)
MONO ABS: 1.2 10*3/uL — AB (ref 0.1–1.0)
MONOS PCT: 9 %
NEUTROS ABS: 8.7 10*3/uL — AB (ref 1.7–7.7)
NEUTROS PCT: 68 %
Platelets: 263 10*3/uL (ref 150–400)
RBC: 4.55 MIL/uL (ref 4.22–5.81)
RDW: 13.8 % (ref 11.5–15.5)
WBC: 13 10*3/uL — ABNORMAL HIGH (ref 4.0–10.5)

## 2016-12-28 LAB — LIPASE, BLOOD: Lipase: 24 U/L (ref 11–51)

## 2016-12-28 LAB — I-STAT CG4 LACTIC ACID, ED: Lactic Acid, Venous: 0.87 mmol/L (ref 0.5–1.9)

## 2016-12-28 MED ORDER — KETOROLAC TROMETHAMINE 30 MG/ML IJ SOLN
30.0000 mg | Freq: Once | INTRAMUSCULAR | Status: AC
  Administered 2016-12-28: 30 mg via INTRAVENOUS
  Filled 2016-12-28: qty 1

## 2016-12-28 MED ORDER — FENTANYL CITRATE (PF) 100 MCG/2ML IJ SOLN
INTRAMUSCULAR | Status: AC
  Filled 2016-12-28: qty 2

## 2016-12-28 MED ORDER — FENTANYL CITRATE (PF) 100 MCG/2ML IJ SOLN
100.0000 ug | Freq: Once | INTRAMUSCULAR | Status: AC
  Administered 2016-12-28: 100 ug via INTRAVENOUS
  Filled 2016-12-28: qty 2

## 2016-12-28 MED ORDER — FENTANYL CITRATE (PF) 100 MCG/2ML IJ SOLN
50.0000 ug | INTRAMUSCULAR | Status: DC | PRN
  Administered 2016-12-28: 50 ug via INTRAVENOUS

## 2016-12-28 MED ORDER — IOPAMIDOL (ISOVUE-300) INJECTION 61%
INTRAVENOUS | Status: AC
  Administered 2016-12-28: 100 mL
  Filled 2016-12-28: qty 100

## 2016-12-28 MED ORDER — LIDOCAINE-EPINEPHRINE 1 %-1:100000 IJ SOLN
10.0000 mL | Freq: Once | INTRAMUSCULAR | Status: AC
  Administered 2016-12-28: 10 mL via INTRADERMAL
  Filled 2016-12-28: qty 10

## 2016-12-28 NOTE — ED Notes (Signed)
Pt informed that MD wanted him ambulated. Pt stated, "Not right now". Will attempt ambulation later. Koleen NimrodAdrian - RN and Arlys JohnBrian - RN informed.
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
## 2016-12-28 NOTE — ED Notes (Addendum)
Patient stating pain in thoracic and lumbar region.  Also having headache.  50 mcg fentanyl given at this time. Patient resting comfortably after
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
## 2016-12-29 LAB — CREATININE, SERUM
CREATININE: 0.83 mg/dL (ref 0.61–1.24)
GFR calc Af Amer: >60 mL/min (ref >60)
GFR calc non Af Amer: >60 mL/min (ref >60)

## 2016-12-29 LAB — CBC
HCT: 41.3 % (ref 39.0–52.0)
Hemoglobin: 14.2 g/dL (ref 13.0–17.0)
MCH: 30.5 pg (ref 26.0–34.0)
MCHC: 34.4 g/dL (ref 30.0–36.0)
MCV: 88.8 fL (ref 78.0–100.0)
PLATELETS: 246 10*3/uL (ref 150–400)
RBC: 4.65 MIL/uL (ref 4.22–5.81)
RDW: 14 % (ref 11.5–15.5)
WBC: 10.7 10*3/uL — ABNORMAL HIGH (ref 4.0–10.5)

## 2016-12-29 LAB — HIV ANTIBODY (ROUTINE TESTING W REFLEX): HIV SCREEN 4TH GENERATION: NONREACTIVE

## 2016-12-29 MED ORDER — ACETAMINOPHEN 325 MG PO TABS
650.0000 mg | ORAL_TABLET | Freq: Four times a day (QID) | ORAL | Status: DC
  Administered 2016-12-29: 650 mg via ORAL
  Filled 2016-12-29: qty 2

## 2016-12-29 MED ORDER — ADULT MULTIVITAMIN W/MINERALS CH
1.0000 | ORAL_TABLET | Freq: Every day | ORAL | Status: DC
  Administered 2016-12-29: 1 via ORAL
  Filled 2016-12-29: qty 1

## 2016-12-29 MED ORDER — KETOROLAC TROMETHAMINE 15 MG/ML IJ SOLN: 15.0000 mg | Freq: Four times a day (QID) | INTRAMUSCULAR | Status: DC

## 2016-12-29 MED ORDER — VITAMIN B-1 100 MG PO TABS
100.0000 mg | ORAL_TABLET | Freq: Every day | ORAL | Status: DC
  Administered 2016-12-29: 100 mg via ORAL
  Filled 2016-12-29: qty 1

## 2016-12-29 MED ORDER — METHOCARBAMOL 500 MG PO TABS
500.0000 mg | ORAL_TABLET | Freq: Three times a day (TID) | ORAL | Status: DC
  Administered 2016-12-29: 500 mg via ORAL
  Filled 2016-12-29: qty 1

## 2016-12-29 MED ORDER — THIAMINE HCL 100 MG/ML IJ SOLN
100.0000 mg | Freq: Every day | INTRAMUSCULAR | Status: DC
  Filled 2016-12-29: qty 2

## 2016-12-29 MED ORDER — OXYCODONE HCL 5 MG PO TABS
5.0000 mg | ORAL_TABLET | ORAL | Status: DC | PRN
  Administered 2016-12-29: 5 mg via ORAL

## 2016-12-29 MED ORDER — LORAZEPAM 2 MG/ML IJ SOLN
0.0000 mg | Freq: Four times a day (QID) | INTRAMUSCULAR | Status: DC
  Filled 2016-12-29: qty 1

## 2016-12-29 MED ORDER — OXYCODONE HCL 5 MG PO TABS: 5.0000 mg | ORAL_TABLET | ORAL | Status: DC | PRN

## 2016-12-29 MED ORDER — ADULT MULTIVITAMIN W/MINERALS CH: 1.0000 | ORAL_TABLET | Freq: Every day | ORAL | Status: DC

## 2016-12-29 MED ORDER — OXYCODONE HCL 5 MG PO TABS
10.0000 mg | ORAL_TABLET | ORAL | Status: DC | PRN
  Filled 2016-12-29: qty 2

## 2016-12-29 MED ORDER — ENOXAPARIN SODIUM 40 MG/0.4ML ~~LOC~~ SOLN
40.0000 mg | SUBCUTANEOUS | Status: DC
  Administered 2016-12-29: 40 mg via SUBCUTANEOUS
  Filled 2016-12-29: qty 0.4

## 2016-12-29 MED ORDER — LORAZEPAM 2 MG/ML IJ SOLN: 1.0000 mg | Freq: Four times a day (QID) | INTRAMUSCULAR | Status: DC | PRN

## 2016-12-29 MED ORDER — ACETAMINOPHEN 325 MG PO TABS: 650.0000 mg | ORAL_TABLET | Freq: Four times a day (QID) | ORAL | Status: DC

## 2016-12-29 MED ORDER — ONDANSETRON HCL 4 MG/2ML IJ SOLN: 4.0000 mg | Freq: Four times a day (QID) | INTRAMUSCULAR | Status: DC | PRN

## 2016-12-29 MED ORDER — MORPHINE SULFATE (PF) 4 MG/ML IV SOLN
2.0000 mg | INTRAVENOUS | Status: DC | PRN
  Administered 2016-12-29: 4 mg via INTRAVENOUS
  Filled 2016-12-29: qty 1

## 2016-12-29 MED ORDER — SODIUM CHLORIDE 0.9 % IV SOLN
INTRAVENOUS | Status: DC
  Administered 2016-12-29: 02:00:00 via INTRAVENOUS

## 2016-12-29 MED ORDER — LORAZEPAM 2 MG/ML IJ SOLN: 0.0000 mg | Freq: Two times a day (BID) | INTRAMUSCULAR | Status: DC

## 2016-12-29 MED ORDER — MORPHINE SULFATE (PF) 4 MG/ML IV SOLN: 2.0000 mg | INTRAVENOUS | Status: DC | PRN

## 2016-12-29 MED ORDER — ONDANSETRON HCL 4 MG PO TABS: 4.0000 mg | ORAL_TABLET | Freq: Four times a day (QID) | ORAL | Status: DC | PRN

## 2016-12-29 MED ORDER — LORAZEPAM 1 MG PO TABS: 1.0000 mg | ORAL_TABLET | Freq: Four times a day (QID) | ORAL | Status: DC | PRN

## 2016-12-29 MED ORDER — FOLIC ACID 1 MG PO TABS
1.0000 mg | ORAL_TABLET | Freq: Every day | ORAL | Status: DC
  Administered 2016-12-29: 1 mg via ORAL
  Filled 2016-12-29: qty 1
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
## 2016-12-29 NOTE — Progress Notes (Signed)
Orthopedic Tech Progress Note Patient Details:  Isac Sarnali XXXNoah 05/25/1976 [id number]  Patient ID: Isac SarnaAli XXXNoah, male   DOB: 02/24/1976, 41 y.o.   MRN: [id number]   Saul FordyceJennifer C Lorina Duffner 12/29/2016, 9:35 [facility] [facility] Lumbar [facility]orset.
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
## 2016-12-31 ENCOUNTER — Emergency Department
Admission: EM | Admit: 2016-12-31 | Discharge: 2016-12-31 | Disposition: A | Discharge status: Home / Self Care | Payer: Self-pay | Attending: Emergency Medicine | Admitting: Emergency Medicine

## 2016-12-31 DIAGNOSIS — R220 Localized swelling, mass and lump, head: Secondary | ICD-10-CM | POA: Insufficient documentation

## 2016-12-31 DIAGNOSIS — Y929 Unspecified place or not applicable: Secondary | ICD-10-CM | POA: Insufficient documentation

## 2016-12-31 DIAGNOSIS — F17210 Nicotine dependence, cigarettes, uncomplicated: Secondary | ICD-10-CM | POA: Insufficient documentation

## 2016-12-31 DIAGNOSIS — Y939 Activity, unspecified: Secondary | ICD-10-CM | POA: Insufficient documentation

## 2016-12-31 DIAGNOSIS — R451 Restlessness and agitation: Secondary | ICD-10-CM | POA: Insufficient documentation

## 2016-12-31 DIAGNOSIS — Y999 Unspecified external cause status: Secondary | ICD-10-CM | POA: Insufficient documentation
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
## 2016-12-31 NOTE — ED Notes (Signed)
Spoke with Dr. Lamont SnowballIfenbark for further imaging orders; no orders stated at this time.
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
## 2017-01-05 ENCOUNTER — Emergency Department (HOSPITAL_COMMUNITY): Payer: No Typology Code available for payment source

## 2017-01-05 ENCOUNTER — Ambulatory Visit (HOSPITAL_COMMUNITY)
Admission: EM | Admit: 2017-01-05 | Discharge: 2017-01-05 | Disposition: A | Discharge status: Nursing Home (Includes ICF) | Payer: Self-pay

## 2017-01-05 ENCOUNTER — Encounter (HOSPITAL_COMMUNITY): Payer: Self-pay | Admitting: Family Medicine

## 2017-01-05 ENCOUNTER — Encounter (HOSPITAL_COMMUNITY): Payer: Self-pay | Admitting: Emergency Medicine

## 2017-01-05 ENCOUNTER — Inpatient Hospital Stay (HOSPITAL_COMMUNITY)
Admission: EM | Admit: 2017-01-05 | Discharge: 2017-01-08 | DRG: 603 | Disposition: A | Discharge status: Home / Self Care | Payer: No Typology Code available for payment source | Attending: Internal Medicine | Admitting: Internal Medicine

## 2017-01-05 DIAGNOSIS — Z79891 Long term (current) use of opiate analgesic: Secondary | ICD-10-CM | POA: Diagnosis not present

## 2017-01-05 DIAGNOSIS — J9811 Atelectasis: Secondary | ICD-10-CM | POA: Diagnosis present

## 2017-01-05 DIAGNOSIS — L03211 Cellulitis of face: Principal | ICD-10-CM | POA: Diagnosis present

## 2017-01-05 DIAGNOSIS — F17210 Nicotine dependence, cigarettes, uncomplicated: Secondary | ICD-10-CM | POA: Diagnosis present

## 2017-01-05 DIAGNOSIS — Z79899 Other long term (current) drug therapy: Secondary | ICD-10-CM | POA: Diagnosis not present

## 2017-01-05 DIAGNOSIS — L0201 Cutaneous abscess of face: Secondary | ICD-10-CM | POA: Diagnosis present

## 2017-01-05 DIAGNOSIS — R220 Localized swelling, mass and lump, head: Secondary | ICD-10-CM

## 2017-01-05 DIAGNOSIS — S0101XD Laceration without foreign body of scalp, subsequent encounter: Secondary | ICD-10-CM

## 2017-01-05 LAB — CBC WITH DIFFERENTIAL/PLATELET
BASOS PCT: 0 %
Basophils Absolute: 0 10*3/uL (ref 0.0–0.1)
EOS ABS: 0.2 10*3/uL (ref 0.0–0.7)
EOS PCT: 1 %
HCT: 41.7 % (ref 39.0–52.0)
Hemoglobin: 14.2 g/dL (ref 13.0–17.0)
LYMPHS ABS: 3.1 10*3/uL (ref 0.7–4.0)
Lymphocytes Relative: 21 %
MCH: 30.5 pg (ref 26.0–34.0)
MCHC: 34.1 g/dL (ref 30.0–36.0)
MCV: 89.5 fL (ref 78.0–100.0)
MONOS PCT: 10 %
Monocytes Absolute: 1.5 10*3/uL — ABNORMAL HIGH (ref 0.1–1.0)
Neutro Abs: 10.3 10*3/uL — ABNORMAL HIGH (ref 1.7–7.7)
Neutrophils Relative %: 68 %
PLATELETS: 277 10*3/uL (ref 150–400)
RBC: 4.66 MIL/uL (ref 4.22–5.81)
RDW: 13.6 % (ref 11.5–15.5)
WBC: 15.1 10*3/uL — AB (ref 4.0–10.5)

## 2017-01-05 LAB — COMPREHENSIVE METABOLIC PANEL
ALK PHOS: 76 U/L (ref 38–126)
ALT: 41 U/L (ref 17–63)
AST: 34 U/L (ref 15–41)
Albumin: 4 g/dL (ref 3.5–5.0)
Anion gap: 8 (ref 5–15)
BUN: 13 mg/dL (ref 6–20)
CHLORIDE: 100 mmol/L — AB (ref 101–111)
CO2: 27 mmol/L (ref 22–32)
CREATININE: 0.89 mg/dL (ref 0.61–1.24)
Calcium: 9.3 mg/dL (ref 8.9–10.3)
Glucose, Bld: 112 mg/dL — ABNORMAL HIGH (ref 65–99)
Potassium: 3.9 mmol/L (ref 3.5–5.1)
Sodium: 135 mmol/L (ref 135–145)
Total Bilirubin: 0.3 mg/dL (ref 0.3–1.2)
Total Protein: 7.2 g/dL (ref 6.5–8.1)

## 2017-01-05 LAB — I-STAT CG4 LACTIC ACID, ED: LACTIC ACID, VENOUS: 1.26 mmol/L (ref 0.5–1.9)

## 2017-01-05 MED ORDER — SODIUM CHLORIDE 0.9 % IV BOLUS (SEPSIS)
1000.0000 mL | Freq: Once | INTRAVENOUS | Status: AC
  Administered 2017-01-05: 1000 mL via INTRAVENOUS

## 2017-01-05 MED ORDER — FENTANYL CITRATE (PF) 100 MCG/2ML IJ SOLN
50.0000 ug | INTRAMUSCULAR | Status: DC | PRN
  Administered 2017-01-05 (×2): 50 ug via INTRAVENOUS
  Filled 2017-01-05 (×2): qty 2

## 2017-01-05 MED ORDER — POLYETHYLENE GLYCOL 3350 17 G PO PACK: 17.0000 g | PACK | Freq: Every day | ORAL | Status: DC | PRN

## 2017-01-05 MED ORDER — IOPAMIDOL (ISOVUE-300) INJECTION 61%
INTRAVENOUS | Status: AC
  Filled 2017-01-05: qty 75

## 2017-01-05 MED ORDER — ONDANSETRON HCL 4 MG/2ML IJ SOLN
4.0000 mg | Freq: Four times a day (QID) | INTRAMUSCULAR | Status: DC | PRN
  Filled 2017-01-05: qty 2

## 2017-01-05 MED ORDER — ACETAMINOPHEN 650 MG RE SUPP: 650.0000 mg | Freq: Four times a day (QID) | RECTAL | Status: DC | PRN

## 2017-01-05 MED ORDER — ACETAMINOPHEN 325 MG PO TABS: 650.0000 mg | ORAL_TABLET | Freq: Four times a day (QID) | ORAL | Status: DC | PRN

## 2017-01-05 MED ORDER — HYDROCODONE-ACETAMINOPHEN 5-325 MG PO TABS
1.0000 | ORAL_TABLET | ORAL | Status: DC | PRN
  Administered 2017-01-05 – 2017-01-06 (×2): 2 via ORAL
  Filled 2017-01-05 (×4): qty 2

## 2017-01-05 MED ORDER — ENOXAPARIN SODIUM 40 MG/0.4ML ~~LOC~~ SOLN
40.0000 mg | SUBCUTANEOUS | Status: DC
  Administered 2017-01-05 – 2017-01-07 (×3): 40 mg via SUBCUTANEOUS
  Filled 2017-01-05 (×3): qty 0.4

## 2017-01-05 MED ORDER — SODIUM CHLORIDE 0.9 % IV SOLN
3.0000 g | Freq: Four times a day (QID) | INTRAVENOUS | Status: DC
  Administered 2017-01-05 – 2017-01-08 (×10): 3 g via INTRAVENOUS
  Filled 2017-01-05 (×12): qty 3

## 2017-01-05 MED ORDER — MORPHINE SULFATE (PF) 4 MG/ML IV SOLN
2.0000 mg | INTRAVENOUS | Status: DC | PRN
  Administered 2017-01-05 – 2017-01-06 (×2): 3 mg via INTRAVENOUS
  Filled 2017-01-05 (×2): qty 1

## 2017-01-05 MED ORDER — BISACODYL 5 MG PO TBEC
5.0000 mg | DELAYED_RELEASE_TABLET | Freq: Every day | ORAL | Status: DC | PRN
Start: 2017-01-05 — End: 2017-01-08

## 2017-01-05 MED ORDER — CLINDAMYCIN PHOSPHATE 600 MG/50ML IV SOLN
600.0000 mg | Freq: Once | INTRAVENOUS | Status: AC
  Administered 2017-01-05: 600 mg via INTRAVENOUS
  Filled 2017-01-05: qty 50

## 2017-01-05 MED ORDER — NICOTINE POLACRILEX 2 MG MT GUM
2.0000 mg | CHEWING_GUM | OROMUCOSAL | Status: DC | PRN
  Filled 2017-01-05: qty 1

## 2017-01-05 MED ORDER — ONDANSETRON HCL 4 MG PO TABS: 4.0000 mg | ORAL_TABLET | Freq: Four times a day (QID) | ORAL | Status: DC | PRN
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
## 2017-01-05 NOTE — Discharge Instructions (Signed)
Go straight to ER for further evaluation of left sided cellulitis/facial swelling. Do not eat of drink anything until cleared by Er provider.

## 2017-01-05 NOTE — ED Triage Notes (Signed)
Pt reports redness and swelling to left side of nose and cheek since yesterday, has a hx of recurring cellulitis. Denies fever at home
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
## 2017-01-05 NOTE — ED Triage Notes (Signed)
Pt here for swelling to face. sts recent cellulitis in his face. Unable to finish the abx due to unforseen circumstances.

## 2017-01-05 NOTE — ED Notes (Signed)
Patient transported to CT 

## 2017-01-06 DIAGNOSIS — S0101XD Laceration without foreign body of scalp, subsequent encounter: Secondary | ICD-10-CM

## 2017-01-06 DIAGNOSIS — L03211 Cellulitis of face: Principal | ICD-10-CM

## 2017-01-06 DIAGNOSIS — F17210 Nicotine dependence, cigarettes, uncomplicated: Secondary | ICD-10-CM

## 2017-01-06 LAB — BASIC METABOLIC PANEL
Anion gap: 12 (ref 5–15)
BUN: 10 mg/dL (ref 6–20)
CHLORIDE: 97 mmol/L — AB (ref 101–111)
CO2: 23 mmol/L (ref 22–32)
Calcium: 8.9 mg/dL (ref 8.9–10.3)
Creatinine, Ser: 0.86 mg/dL (ref 0.61–1.24)
GFR calc Af Amer: >60 mL/min (ref >60)
GFR calc non Af Amer: >60 mL/min (ref >60)
GLUCOSE: 105 mg/dL — AB (ref 65–99)
POTASSIUM: 3.9 mmol/L (ref 3.5–5.1)
Sodium: 132 mmol/L — ABNORMAL LOW (ref 135–145)

## 2017-01-06 LAB — CBC WITH DIFFERENTIAL/PLATELET
Basophils Absolute: 0 10*3/uL (ref 0.0–0.1)
Basophils Relative: 0 %
EOS PCT: 1 %
Eosinophils Absolute: 0.1 10*3/uL (ref 0.0–0.7)
HCT: 41.5 % (ref 39.0–52.0)
HEMOGLOBIN: 13.9 g/dL (ref 13.0–17.0)
LYMPHS ABS: 2.8 10*3/uL (ref 0.7–4.0)
LYMPHS PCT: 18 %
MCH: 30.2 pg (ref 26.0–34.0)
MCHC: 33.5 g/dL (ref 30.0–36.0)
MCV: 90 fL (ref 78.0–100.0)
MONOS PCT: 9 %
Monocytes Absolute: 1.4 10*3/uL — ABNORMAL HIGH (ref 0.1–1.0)
Neutro Abs: 11.6 10*3/uL — ABNORMAL HIGH (ref 1.7–7.7)
Neutrophils Relative %: 72 %
Platelets: 270 10*3/uL (ref 150–400)
RBC: 4.61 MIL/uL (ref 4.22–5.81)
RDW: 13.6 % (ref 11.5–15.5)
WBC: 15.9 10*3/uL — ABNORMAL HIGH (ref 4.0–10.5)

## 2017-01-06 LAB — MRSA PCR SCREENING: MRSA by PCR: POSITIVE — AB

## 2017-01-06 LAB — RAPID URINE DRUG SCREEN, HOSP PERFORMED
Amphetamines: NOT DETECTED
BENZODIAZEPINES: NOT DETECTED
Barbiturates: NOT DETECTED
COCAINE: NOT DETECTED
Opiates: POSITIVE — AB
TETRAHYDROCANNABINOL: POSITIVE — AB

## 2017-01-06 LAB — GLUCOSE, CAPILLARY: Glucose-Capillary: 127 mg/dL — ABNORMAL HIGH (ref 65–99)

## 2017-01-06 MED ORDER — MUPIROCIN 2 % EX OINT
1.0000 "application " | TOPICAL_OINTMENT | Freq: Two times a day (BID) | CUTANEOUS | Status: DC
  Administered 2017-01-06 – 2017-01-07 (×5): 1 via NASAL
  Filled 2017-01-06: qty 22

## 2017-01-06 MED ORDER — CHLORHEXIDINE GLUCONATE CLOTH 2 % EX PADS
6.0000 | MEDICATED_PAD | Freq: Every day | CUTANEOUS | Status: DC
  Administered 2017-01-06 – 2017-01-07 (×2): 6 via TOPICAL
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
## 2017-01-07 LAB — CBC
HCT: 39.2 % (ref 39.0–52.0)
Hemoglobin: 13 g/dL (ref 13.0–17.0)
MCH: 30.2 pg (ref 26.0–34.0)
MCHC: 33.2 g/dL (ref 30.0–36.0)
MCV: 91 fL (ref 78.0–100.0)
PLATELETS: 288 10*3/uL (ref 150–400)
RBC: 4.31 MIL/uL (ref 4.22–5.81)
RDW: 13.9 % (ref 11.5–15.5)
WBC: 8.8 10*3/uL (ref 4.0–10.5)

## 2017-01-07 LAB — GLUCOSE, CAPILLARY: Glucose-Capillary: 113 mg/dL — ABNORMAL HIGH (ref 65–99)

## 2017-01-07 MED ORDER — AMOXICILLIN-POT CLAVULANATE 875-125 MG PO TABS: 1.0000 | ORAL_TABLET | Freq: Two times a day (BID) | ORAL | 0 refills | Status: DC
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
## 2017-01-08 LAB — GLUCOSE, CAPILLARY: Glucose-Capillary: 104 mg/dL — ABNORMAL HIGH (ref 65–99)

## 2017-01-08 MED ORDER — HYDROCODONE-ACETAMINOPHEN 5-325 MG PO TABS: 1.0000 | ORAL_TABLET | Freq: Four times a day (QID) | ORAL | 0 refills | Status: AC | PRN

## 2017-01-08 NOTE — Progress Notes (Signed)
Johnathan LeaAli Wagner to be D/C'd  per MD order. Discussed with the patient and all questions fully answered.  VSS, Skin clean, dry and intact without evidence of skin break down, no evidence of skin tears noted.  IV catheter discontinued intact. Site without signs and symptoms of complications. Dressing and pressure applied.  An After Visit Summary was printed and given to the patient. Patient received prescription.  D/c education completed with patient/family including follow up instructions, medication list, d/c activities limitations if indicated, with other d/c instructions as indicated by MD - patient able to verbalize understanding, all questions fully answered.   Patient instructed to return to ED, call 911, or call MD for any changes in condition.   Patient to be escorted via WC, and D/C home via private auto.
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
## 2017-01-08 NOTE — Progress Notes (Signed)
Was notified by MD Joya MartyrPreetha that patient needed to be discharged right away because he needed to go straight to the oral surgery office. Will get together his d/c paperwork. Will be unable to do an assessment due to getting patient out on time for appointment.

## 2017-01-08 NOTE — Discharge Instructions (Signed)

## 2017-03-29 ENCOUNTER — Ambulatory Visit (HOSPITAL_COMMUNITY)
Admission: EM | Admit: 2017-03-29 | Discharge: 2017-03-29 | Disposition: A | Discharge status: Home / Self Care | Payer: Self-pay | Attending: Emergency Medicine | Admitting: Emergency Medicine

## 2017-03-29 ENCOUNTER — Encounter (HOSPITAL_COMMUNITY): Payer: Self-pay | Admitting: Emergency Medicine

## 2017-03-29 DIAGNOSIS — G4489 Other headache syndrome: Secondary | ICD-10-CM

## 2017-03-29 DIAGNOSIS — S39012A Strain of muscle, fascia and tendon of lower back, initial encounter: Secondary | ICD-10-CM

## 2017-03-29 MED ORDER — NAPROXEN 500 MG PO TABS: 500.0000 mg | ORAL_TABLET | Freq: Two times a day (BID) | ORAL | 0 refills | Status: DC

## 2017-03-29 MED ORDER — CYCLOBENZAPRINE HCL 10 MG PO TABS: 10.0000 mg | ORAL_TABLET | Freq: Two times a day (BID) | ORAL | 0 refills | Status: DC | PRN

## 2017-03-29 NOTE — Discharge Instructions (Signed)
Call Occupational Health today and make an appointment for Letters needed for court.
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
## 2017-04-12 ENCOUNTER — Ambulatory Visit: Payer: Self-pay | Attending: Internal Medicine | Admitting: Physician Assistant

## 2017-04-12 ENCOUNTER — Encounter: Payer: Self-pay | Admitting: Physician Assistant

## 2017-04-12 ENCOUNTER — Ambulatory Visit: Payer: Self-pay | Attending: Internal Medicine | Admitting: Licensed Clinical Social Worker

## 2017-04-12 VITALS — BP 134/96 | HR 76 | Temp 98.3°F | Resp 20 | Wt 232.2 lb

## 2017-04-12 DIAGNOSIS — R51 Headache: Secondary | ICD-10-CM | POA: Insufficient documentation

## 2017-04-12 DIAGNOSIS — R030 Elevated blood-pressure reading, without diagnosis of hypertension: Secondary | ICD-10-CM | POA: Insufficient documentation

## 2017-04-12 DIAGNOSIS — F419 Anxiety disorder, unspecified: Secondary | ICD-10-CM

## 2017-04-12 DIAGNOSIS — G44309 Post-traumatic headache, unspecified, not intractable: Secondary | ICD-10-CM

## 2017-04-12 DIAGNOSIS — G8929 Other chronic pain: Secondary | ICD-10-CM | POA: Insufficient documentation

## 2017-04-12 DIAGNOSIS — F4310 Post-traumatic stress disorder, unspecified: Secondary | ICD-10-CM | POA: Insufficient documentation

## 2017-04-12 DIAGNOSIS — S0990XS Unspecified injury of head, sequela: Secondary | ICD-10-CM | POA: Insufficient documentation

## 2017-04-12 DIAGNOSIS — F4323 Adjustment disorder with mixed anxiety and depressed mood: Secondary | ICD-10-CM

## 2017-04-12 DIAGNOSIS — F0781 Postconcussional syndrome: Secondary | ICD-10-CM | POA: Insufficient documentation

## 2017-04-12 DIAGNOSIS — M549 Dorsalgia, unspecified: Secondary | ICD-10-CM | POA: Insufficient documentation

## 2017-04-12 NOTE — Patient Instructions (Addendum)
[phone]  Main hospital ask for the ED   I would also suggest you get a copy of all of your medical records to provide to the [facility]  Check blood pressure daily and record
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
## 2017-05-08 ENCOUNTER — Ambulatory Visit: Payer: Self-pay | Attending: Family Medicine | Admitting: Family Medicine

## 2017-05-08 ENCOUNTER — Encounter: Payer: Self-pay | Admitting: Family Medicine

## 2017-05-08 VITALS — BP 121/82 | HR 83 | Temp 97.7°F | Resp 18 | Ht 72.0 in | Wt 239.4 lb

## 2017-05-08 DIAGNOSIS — F0781 Postconcussional syndrome: Secondary | ICD-10-CM | POA: Insufficient documentation

## 2017-05-08 DIAGNOSIS — G8929 Other chronic pain: Secondary | ICD-10-CM | POA: Insufficient documentation

## 2017-05-08 DIAGNOSIS — M545 Low back pain: Secondary | ICD-10-CM | POA: Insufficient documentation

## 2017-05-08 DIAGNOSIS — G44309 Post-traumatic headache, unspecified, not intractable: Secondary | ICD-10-CM | POA: Insufficient documentation

## 2017-05-08 DIAGNOSIS — F419 Anxiety disorder, unspecified: Secondary | ICD-10-CM | POA: Insufficient documentation

## 2017-05-08 DIAGNOSIS — S0990XS Unspecified injury of head, sequela: Secondary | ICD-10-CM

## 2017-05-08 MED ORDER — IBUPROFEN 800 MG PO TABS: 800.0000 mg | ORAL_TABLET | Freq: Three times a day (TID) | ORAL | 0 refills | Status: AC | PRN

## 2017-05-08 NOTE — Patient Instructions (Addendum)
Apply for orange card to complete referral process.    Post-Concussion Syndrome Post-concussion syndrome is the symptoms that can occur after a head injury. These symptoms can last from weeks to months. Follow these instructions at home:  Take medicines only as told by your doctor.  Do not take aspirin.  Sleep with your head raised to help with headaches.  Avoid activities that can cause another head injury. ? Do not play contact sports like football, hockey, soccer, or basketball. ? Do not do other risky activities like downhill skiing, martial arts, or horseback riding until your doctor says it is okay.  Keep all follow-up visits as told by your doctor. This is important. Contact a doctor if:  You have a harder time: ? Paying attention. ? Focusing. ? Remembering. ? Learning new information. ? Dealing with stress.  You need more time to complete tasks.  You are easily bothered (irritable).  You have more symptoms. Get help if you have any of these symptoms for more than two weeks after your injury:  Long-lasting (chronic) headaches.  Dizziness.  Trouble balancing.  Feeling sick to your stomach (nauseous).  Trouble with your vision.  Noise or light bothers you more.  Depression.  Mood swings.  Feeling worried (anxious).  Easily bothered.  Memory problems.  Trouble concentrating or paying attention.  Sleep problems.  Feeling tired all of the time.  Get help right away if:  You feel confused.  You feel very sleepy.  You are hard to wake up.  You feel sick to your stomach.  You keep throwing up (vomiting).  You feel like you are moving when you are not (vertigo).  Your eyes move back and forth very quickly.  You start shaking (convulsing) or pass out (faint).  You have very bad headaches that do not get better with medicine.  You cannot use your arms or legs like normal.  One of the black centers of your eyes (pupils) is bigger than the  other.  You have clear or bloody fluid coming from your nose or ears.  Your problems get worse, not better. This information is not intended to replace advice given to you by your health care provider. Make sure you discuss any questions you have with your health care provider. Document Released: 08/31/2004 Document Revised: 12/30/2015 Document Reviewed: 10/29/2013 Elsevier Interactive Patient Education  2018 Elsevier Inc[employer]  Back Pain, Adult Back pain is very common. The pain often gets better over time. The cause of back pain is usually not dangerous. Most people can learn to manage their back pain on their own. Follow these instructions at home: Watch your back pain for any changes. The following actions may help to lessen any pain you are feeling:  Stay active. Start with short walks on flat ground if you can. Try to walk farther each day.  Exercise regularly as told by your doctor. Exercise helps your back heal faster. It also helps avoid future injury by keeping your muscles strong and flexible.  Do not sit, drive, or stand in one place for more than 30 minutes.  Do not stay in bed. Resting more than 1-2 days can slow down your recovery.  Be careful when you bend or lift an object. Use good form when lifting: ? Bend at your knees. ? Keep the object close to your body. ? Do not twist.  Sleep on a firm mattress. Lie on your side, and bend your knees. If you lie on your back, put a  pillow under your knees.  Take medicines only as told by your doctor.  Put ice on the injured area. ? Put ice in a plastic bag. ? Place a towel between your skin and the bag. ? Leave the ice on for 20 minutes, 2-3 times a day for the first 2-3 days. After that, you can switch between ice and heat packs.  Avoid feeling anxious or stressed. Find good ways to deal with stress, such as exercise.  Maintain a healthy weight. Extra weight puts stress on your back.  Contact a doctor if:  You have pain  that does not go away with rest or medicine.  You have worsening pain that goes down into your legs or buttocks.  You have pain that does not get better in one week.  You have pain at night.  You lose weight.  You have a fever or chills. Get help right away if:  You cannot control when you poop (bowel movement) or pee (urinate).  Your arms or legs feel weak.  Your arms or legs lose feeling (numbness).  You feel sick to your stomach (nauseous) or throw up (vomit).  You have belly (abdominal) pain.  You feel like you may pass out (faint). This information is not intended to replace advice given to you by your health care provider. Make sure you discuss any questions you have with your health care provider. Document Released: 01/10/2008 Document Revised: 12/30/2015 Document Reviewed: 11/25/2013 Elsevier Interactive Patient Education  [employer].

## 2017-05-08 NOTE — Progress Notes (Signed)
Patient is here for back/neck pain   Frequent headaches
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
## 2017-06-20 ENCOUNTER — Encounter (HOSPITAL_COMMUNITY): Payer: Self-pay | Admitting: Emergency Medicine

## 2017-06-20 ENCOUNTER — Emergency Department (HOSPITAL_COMMUNITY)
Admission: EM | Admit: 2017-06-20 | Discharge: 2017-06-20 | Disposition: A | Discharge status: Home / Self Care | Payer: Self-pay | Attending: Emergency Medicine | Admitting: Emergency Medicine

## 2017-06-20 ENCOUNTER — Emergency Department (HOSPITAL_COMMUNITY): Payer: Self-pay

## 2017-06-20 DIAGNOSIS — L03211 Cellulitis of face: Secondary | ICD-10-CM | POA: Insufficient documentation

## 2017-06-20 DIAGNOSIS — Z79899 Other long term (current) drug therapy: Secondary | ICD-10-CM | POA: Insufficient documentation

## 2017-06-20 DIAGNOSIS — Z87891 Personal history of nicotine dependence: Secondary | ICD-10-CM | POA: Insufficient documentation

## 2017-06-20 LAB — I-STAT CHEM 8, ED
BUN: 13 mg/dL (ref 6–20)
CALCIUM ION: 1.2 mmol/L (ref 1.15–1.40)
CHLORIDE: 104 mmol/L (ref 101–111)
Creatinine, Ser: 0.8 mg/dL (ref 0.61–1.24)
Glucose, Bld: 95 mg/dL (ref 65–99)
HEMATOCRIT: 44 % (ref 39.0–52.0)
Hemoglobin: 15 g/dL (ref 13.0–17.0)
Potassium: 3.7 mmol/L (ref 3.5–5.1)
SODIUM: 140 mmol/L (ref 135–145)
TCO2: 26 mmol/L (ref 22–32)

## 2017-06-20 LAB — CBC WITH DIFFERENTIAL/PLATELET
BASOS ABS: 0 10*3/uL (ref 0.0–0.1)
BASOS PCT: 0 %
Eosinophils Absolute: 0.2 10*3/uL (ref 0.0–0.7)
Eosinophils Relative: 2 %
HCT: 42.4 % (ref 39.0–52.0)
Hemoglobin: 15.1 g/dL (ref 13.0–17.0)
Lymphocytes Relative: 35 %
Lymphs Abs: 3.7 10*3/uL (ref 0.7–4.0)
MCH: 31.1 pg (ref 26.0–34.0)
MCHC: 35.6 g/dL (ref 30.0–36.0)
MCV: 87.2 fL (ref 78.0–100.0)
Monocytes Absolute: 1.1 10*3/uL — ABNORMAL HIGH (ref 0.1–1.0)
Monocytes Relative: 11 %
NEUTROS ABS: 5.6 10*3/uL (ref 1.7–7.7)
NEUTROS PCT: 52 %
PLATELETS: 278 10*3/uL (ref 150–400)
RBC: 4.86 MIL/uL (ref 4.22–5.81)
RDW: 13 % (ref 11.5–15.5)
WBC: 10.7 10*3/uL — AB (ref 4.0–10.5)

## 2017-06-20 MED ORDER — CLINDAMYCIN HCL 150 MG PO CAPS
450.0000 mg | ORAL_CAPSULE | Freq: Once | ORAL | Status: AC
  Administered 2017-06-20: 450 mg via ORAL
  Filled 2017-06-20: qty 3

## 2017-06-20 MED ORDER — CLINDAMYCIN HCL 150 MG PO CAPS: 450.0000 mg | ORAL_CAPSULE | Freq: Three times a day (TID) | ORAL | 0 refills | Status: AC

## 2017-06-20 MED ORDER — DOXYCYCLINE HYCLATE 100 MG PO TABS
100.0000 mg | ORAL_TABLET | Freq: Once | ORAL | Status: DC
  Filled 2017-06-20: qty 1

## 2017-06-20 MED ORDER — DOXYCYCLINE HYCLATE 100 MG PO CAPS: 100.0000 mg | ORAL_CAPSULE | Freq: Two times a day (BID) | ORAL | 0 refills | Status: AC

## 2017-06-20 MED ORDER — IOPAMIDOL (ISOVUE-300) INJECTION 61%
INTRAVENOUS | Status: AC
  Administered 2017-06-20: 75 mL
  Filled 2017-06-20: qty 75

## 2017-06-20 MED ORDER — DOXYCYCLINE HYCLATE 100 MG PO TABS
100.0000 mg | ORAL_TABLET | Freq: Once | ORAL | Status: AC
  Administered 2017-06-20: 100 mg via ORAL

## 2017-06-20 MED ORDER — HYDROCODONE-ACETAMINOPHEN 5-325 MG PO TABS
1.0000 | ORAL_TABLET | Freq: Once | ORAL | Status: AC
  Administered 2017-06-20: 1 via ORAL
  Filled 2017-06-20: qty 1

## 2017-06-20 MED ORDER — DOXYCYCLINE HYCLATE 100 MG PO CAPS: 100.0000 mg | ORAL_CAPSULE | Freq: Two times a day (BID) | ORAL | 0 refills | Status: DC

## 2017-06-20 NOTE — ED Provider Notes (Signed)
[facility] Provider Note   CSN: [id number] Arrival date & time: 06/20/17  1504     History   Chief Complaint Chief Complaint  Patient presents with  . Facial Injury    HPI Alda Leali Canto is a 41 y.o. male who presents with 4 days of left-sided facial redness, swelling and pain.  Patient reports that symptoms have progressively worsened since onset.  He has been taking Tylenol for pain with minimal improvement.  He reports the pain is worsened with palpation and movement of the face.  He reports that he has been able to tolerate his secretions and tolerate p.o. without any difficulty.  Patient reports that this is happened to him previously and reports that it was positive for MRSA and had to receive IV antibiotics.  Patient denies any fevers, difficulty swallowing, difficulty breathing, vomiting, vision changes.  The history is provided by the patient.    History reviewed. No pertinent past medical history.  Patient Active Problem List   Diagnosis Date Noted  . Left facial swelling 01/05/2017  . Facial cellulitis 01/05/2017  . Cigarette smoker 01/05/2017  . Scalp laceration, subsequent encounter 01/05/2017  . Post concussive syndrome 12/28/2016    Past Surgical History:  Procedure Laterality Date  . none         Home Medications    Prior to Admission medications   Medication Sig Start Date End Date Taking? Authorizing Provider  clindamycin (CLEOCIN) 150 MG capsule Take 3 capsules (450 mg total) 3 (three) times daily for 7 days by mouth. 06/20/17 06/27/17  Maxwell CaulLayden, Semaja Lymon A, PA-C  doxycycline (VIBRAMYCIN) 100 MG capsule Take 1 capsule (100 mg total) 2 (two) times daily for 7 days by mouth. 06/20/17 06/27/17  Maxwell CaulLayden, Debbe Crumble A, PA-C  HYDROcodone-acetaminophen (NORCO/VICODIN) 5-325 MG tablet Take 1-2 tablets by mouth every 6 (six) hours as needed for moderate pain. Patient not taking: Reported on 04/12/2017 01/08/17   Zannie CoveJoseph, Preetha, MD    ibuprofen (ADVIL,MOTRIN) 800 MG tablet Take 1 tablet (800 mg total) by mouth every 8 (eight) hours as needed (Take with food.). 05/08/17   Lizbeth BarkHairston, Mandesia R, FNP    Family History Family History  Problem Relation Age of Onset  . Diabetes Father   . CAD Neg Hx   . COPD Neg Hx     Social History Social History   Tobacco Use  . Smoking status: Former Smoker    Packs/day: 0.25    Types: Cigarettes  . Smokeless tobacco: Never Used  . Tobacco comment: 4 months  Substance Use Topics  . Alcohol use: No  . Drug use: No    Comment: former user     Allergies   Patient has no known allergies.   Review of Systems Review of Systems  Constitutional: Negative for fever.  HENT: Positive for facial swelling. Negative for trouble swallowing.   Eyes: Negative for visual disturbance.  Respiratory: Negative for shortness of breath.   Gastrointestinal: Negative for vomiting.     Physical Exam Updated Vital Signs BP (!) 128/99 (BP Location: Right Arm)   Pulse 85   Temp 98.3 F (36.8 C) (Oral)   Resp 12   SpO2 98%   Physical Exam  Constitutional: He appears well-developed and well-nourished.  Sitting comfortably on examination table  HENT:  Head: Normocephalic and atraumatic.  Nose: No nasal deformity or septal deviation.  Left-sided facial swelling noted to the nasolabial fold that extends to the nasal bridge and left cheek with overlying  redness and warmth. No fluctuance noted. No evidence of intranasal abscess.  Diffuse dental caries scattered throughout but no evidence of abscess.  No focal gingival swelling or erythema. No fluctuance noted.     Eyes: Conjunctivae and EOM are normal. Pupils are equal, round, and reactive to light. Right eye exhibits no discharge. Left eye exhibits no discharge. No scleral icterus.  No swelling noted to the upper and lower eyelids bilaterally.  No periorbital tenderness, erythema, warmth.  EOMs intact without any difficulty.  Pulmonary/Chest:  Effort normal.  No evidence of respiratory distress. Able to speak in full sentences without difficulty.  Lymphadenopathy:    He has no cervical adenopathy.  Neurological: He is alert.  Skin: Skin is warm and dry.  Psychiatric: He has a normal mood and affect. His speech is normal and behavior is normal.  Nursing note and vitals reviewed.        ED Treatments / Results  Labs (all labs ordered are listed, but only abnormal results are displayed) Labs Reviewed  CBC WITH DIFFERENTIAL/PLATELET - Abnormal; Notable for the following components:      Result Value   WBC 10.7 (*)    Monocytes Absolute 1.1 (*)    All other components within normal limits  I-STAT CHEM 8, ED    EKG  EKG Interpretation None       Radiology No results found.  Procedures Procedures (including critical care time)  Medications Ordered in ED Medications  iopamidol (ISOVUE-300) 61 % injection (75 mLs  Contrast Given 06/20/17 1757)  HYDROcodone-acetaminophen (NORCO/VICODIN) 5-325 MG per tablet 1 tablet (1 tablet Oral Given 06/20/17 2018)  doxycycline (VIBRA-TABS) tablet 100 mg (100 mg Oral Given 06/20/17 2018)  clindamycin (CLEOCIN) capsule 450 mg (450 mg Oral Given 06/20/17 2018)     Initial Impression / Assessment and Plan / ED Course  I have reviewed the triage vital signs and the nursing notes.  Pertinent labs & imaging results that were available during my care of the patient were reviewed by me and considered in my medical decision making (see chart for details).     41 y.o. M who presents with 4 days of left sided facial swelling, redness and pain.  No fevers, vomiting, difficulty breathing.  No vision changes. Patient is afebrile, non-toxic appearing, sitting comfortably on examination table. Vital signs reviewed and stable.  History/physical exam are not concerning for Ludwig angina or peritonsillar abscess.  History/physical exam are not concerning for orbital cellulitis. Consider  cellulitis vs abscess. Will obtain CT maxillofacial for further eval.   Records reviewed to the patient is similar episode of symptoms 12/04/16.  At that time, CT maxillofacial showed cellulitis with concern for abscess.  At that time he was given IV antibiotic to the emergency department and sent home with a prescription for antibiotics.  Patient returned the emergency department 2 days later despite compliance with antibiotics with worsening swelling and pain.  At that time he was hospitalized for IV antibiotics. Patient was MRSA positive at that time.  Labs and imaging reviewed.  CBC showed mild leukocytosis.  Otherwise unremarkable.  CT reviewed.  There is concern for cellulitis but no abscess identified.  There are some periapical lucency of the teeth but otherwise no abnormalities. Discussed patient with Dr. Ranae PalmsYelverton.  Patient has a reassuring clinical presentation and reassuring vitals.  No evidence of respiratory distress.  Do not think that patient needs IV antibiotics at this time.  Records reviewed show that when this had happened previously, patient  had not been covered for MRSA.  We will plan to do out patient antibiotics that cover for both MRSA and anaerobes.  Discussed plan with patient.  He is agreeable to plan.   Records reviewed.  Patient has had lab work in the last 6 months that shows no elevations of his liver enzymes.  We will plan to start patient on doxycycline and clindamycin.  Discussed with patient.  Patient instructed to closely monitor the symptoms and return the emergency department for any worsening signs or symptoms. Strict return precautions discussed. Patient expresses understanding and agreement to plan.     Final Clinical Impressions(s) / ED Diagnoses   Final diagnoses:  Facial cellulitis    ED Discharge Orders        Ordered    doxycycline (VIBRAMYCIN) 100 MG capsule  2 times daily,   Status:  Discontinued     06/20/17 2006    doxycycline (VIBRAMYCIN) 100 MG  capsule  2 times daily     06/20/17 2010    clindamycin (CLEOCIN) 150 MG capsule  3 times daily     06/20/17 2010       Rosana Hoes 06/22/17 1817    Loren Racer, MD 06/26/17 [phone]

## 2017-06-20 NOTE — ED Triage Notes (Signed)
Bump in left nostril since Friday states it is hard has hx of mersa in nose

## 2017-06-20 NOTE — Discharge Instructions (Signed)
Take antibiotics as directed. Please take all of your antibiotics until finished.  You can take Tylenol or Ibuprofen as directed for pain. You can alternate Tylenol and Ibuprofen every 4 hours. If you take Tylenol at 1pm, then you can take Ibuprofen at 5pm. Then you can take Tylenol again at 9pm.   Follow-up your primary care doctor in the next 24-48 hours for further evaluation.  Return to the emergency department swelling of the face, difficulty breathing, difficulty swallowing, fevers or any other worsening or concerning symptoms.

## 2017-07-03 ENCOUNTER — Ambulatory Visit: Payer: Self-pay | Admitting: Family Medicine

## 2017-07-17 ENCOUNTER — Ambulatory Visit: Payer: Self-pay | Admitting: Family Medicine

## 2017-08-09 ENCOUNTER — Ambulatory Visit: Payer: Self-pay | Admitting: Family Medicine
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
